# Patient Record
Sex: Male | Born: 1990 | Hispanic: Yes | Marital: Single | Smoking: Current every day smoker
Health system: Southern US, Community
[De-identification: ages and names within clinical notes are randomized; demographics above are authoritative.]

---

## 2013-11-15 ENCOUNTER — Emergency Department (HOSPITAL_COMMUNITY)
Admission: EM | Admit: 2013-11-15 | Discharge: 2013-11-15 | Disposition: A | Discharge status: Home / Self Care | Payer: No Typology Code available for payment source | Attending: Emergency Medicine | Admitting: Emergency Medicine

## 2013-11-15 ENCOUNTER — Emergency Department (HOSPITAL_COMMUNITY): Payer: No Typology Code available for payment source

## 2013-11-15 ENCOUNTER — Encounter (HOSPITAL_COMMUNITY): Payer: Self-pay | Admitting: Emergency Medicine

## 2013-11-15 DIAGNOSIS — S6990XA Unspecified injury of unspecified wrist, hand and finger(s), initial encounter: Secondary | ICD-10-CM | POA: Insufficient documentation

## 2013-11-15 DIAGNOSIS — Y9389 Activity, other specified: Secondary | ICD-10-CM | POA: Insufficient documentation

## 2013-11-15 DIAGNOSIS — S298XXA Other specified injuries of thorax, initial encounter: Secondary | ICD-10-CM | POA: Insufficient documentation

## 2013-11-15 DIAGNOSIS — S161XXA Strain of muscle, fascia and tendon at neck level, initial encounter: Secondary | ICD-10-CM

## 2013-11-15 DIAGNOSIS — S060X9A Concussion with loss of consciousness of unspecified duration, initial encounter: Secondary | ICD-10-CM | POA: Insufficient documentation

## 2013-11-15 DIAGNOSIS — T148XXA Other injury of unspecified body region, initial encounter: Secondary | ICD-10-CM

## 2013-11-15 DIAGNOSIS — Y92410 Unspecified street and highway as the place of occurrence of the external cause: Secondary | ICD-10-CM | POA: Insufficient documentation

## 2013-11-15 DIAGNOSIS — S139XXA Sprain of joints and ligaments of unspecified parts of neck, initial encounter: Secondary | ICD-10-CM | POA: Insufficient documentation

## 2013-11-15 DIAGNOSIS — IMO0002 Reserved for concepts with insufficient information to code with codable children: Secondary | ICD-10-CM | POA: Insufficient documentation

## 2013-11-15 MED ORDER — CYCLOBENZAPRINE HCL 5 MG PO TABS: 5.0000 mg | ORAL_TABLET | Freq: Three times a day (TID) | ORAL | Status: DC | PRN

## 2013-11-15 MED ORDER — HYDROCODONE-ACETAMINOPHEN 5-325 MG PO TABS: 1.0000 | ORAL_TABLET | Freq: Every evening | ORAL | Status: DC | PRN

## 2013-11-15 MED ORDER — IBUPROFEN 600 MG PO TABS: 600.0000 mg | ORAL_TABLET | Freq: Four times a day (QID) | ORAL | Status: DC | PRN

## 2013-11-15 MED ORDER — OXYCODONE-ACETAMINOPHEN 5-325 MG PO TABS
1.0000 | ORAL_TABLET | Freq: Once | ORAL | Status: AC
  Administered 2013-11-15: 1 via ORAL
  Filled 2013-11-15: qty 1

## 2013-11-15 NOTE — ED Notes (Signed)
Pt given d/c instructions and verbalized understanding. NAD at this time. VS are stable.  
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
## 2013-11-16 NOTE — ED Provider Notes (Signed)
Medical screening examination/treatment/procedure(s) were performed by non-physician practitioner and as supervising physician I was immediately available for consultation/collaboration.  EKG Interpretation   None        Shon Baton, MD 11/16/13 [phone]

## 2014-04-22 DIAGNOSIS — S0083XA Contusion of other part of head, initial encounter: Principal | ICD-10-CM | POA: Insufficient documentation

## 2014-04-22 DIAGNOSIS — Z23 Encounter for immunization: Secondary | ICD-10-CM | POA: Insufficient documentation

## 2014-04-22 DIAGNOSIS — F17200 Nicotine dependence, unspecified, uncomplicated: Secondary | ICD-10-CM | POA: Insufficient documentation

## 2014-04-22 DIAGNOSIS — S1093XA Contusion of unspecified part of neck, initial encounter: Principal | ICD-10-CM

## 2014-04-22 DIAGNOSIS — S298XXA Other specified injuries of thorax, initial encounter: Secondary | ICD-10-CM | POA: Insufficient documentation

## 2014-04-22 DIAGNOSIS — S0003XA Contusion of scalp, initial encounter: Secondary | ICD-10-CM | POA: Insufficient documentation
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
## 2014-04-23 ENCOUNTER — Emergency Department (HOSPITAL_COMMUNITY)
Admission: EM | Admit: 2014-04-23 | Discharge: 2014-04-23 | Disposition: A | Discharge status: Home / Self Care | Payer: Self-pay | Attending: Emergency Medicine | Admitting: Emergency Medicine

## 2014-04-23 ENCOUNTER — Emergency Department (HOSPITAL_COMMUNITY): Payer: Self-pay

## 2014-04-23 ENCOUNTER — Encounter (HOSPITAL_COMMUNITY): Payer: Self-pay | Admitting: Emergency Medicine

## 2014-04-23 DIAGNOSIS — S0083XA Contusion of other part of head, initial encounter: Secondary | ICD-10-CM

## 2014-04-23 LAB — CBG MONITORING, ED: GLUCOSE-CAPILLARY: 89 mg/dL (ref 70–99)

## 2014-04-23 MED ORDER — IBUPROFEN 800 MG PO TABS: 800.0000 mg | ORAL_TABLET | Freq: Three times a day (TID) | ORAL | Status: DC

## 2014-04-23 MED ORDER — TETANUS-DIPHTH-ACELL PERTUSSIS 5-2.5-18.5 LF-MCG/0.5 IM SUSP
0.5000 mL | Freq: Once | INTRAMUSCULAR | Status: AC
  Administered 2014-04-23: 0.5 mL via INTRAMUSCULAR
  Filled 2014-04-23: qty 0.5

## 2014-04-23 MED ORDER — IBUPROFEN 800 MG PO TABS
800.0000 mg | ORAL_TABLET | Freq: Once | ORAL | Status: AC
  Administered 2014-04-23: 800 mg via ORAL
  Filled 2014-04-23: qty 1
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
## 2014-04-23 NOTE — ED Provider Notes (Signed)
Medical screening examination/treatment/procedure(s) were performed by non-physician practitioner and as supervising physician I was immediately available for consultation/collaboration.   EKG Interpretation None        Enid Skeens, MD 04/23/14 [id number]

## 2014-11-23 ENCOUNTER — Encounter (HOSPITAL_COMMUNITY): Admission: EM | Disposition: A | Discharge status: Home / Self Care | Payer: Self-pay | Source: Home / Self Care

## 2014-11-23 ENCOUNTER — Encounter (HOSPITAL_COMMUNITY): Payer: Self-pay | Admitting: Emergency Medicine

## 2014-11-23 ENCOUNTER — Emergency Department (HOSPITAL_COMMUNITY): Payer: Self-pay

## 2014-11-23 ENCOUNTER — Inpatient Hospital Stay (HOSPITAL_COMMUNITY): Payer: Self-pay

## 2014-11-23 ENCOUNTER — Emergency Department (HOSPITAL_COMMUNITY): Payer: Self-pay | Admitting: Anesthesiology

## 2014-11-23 ENCOUNTER — Inpatient Hospital Stay (HOSPITAL_COMMUNITY)
Admission: EM | Admit: 2014-11-23 | Discharge: 2014-11-26 | DRG: 576 | Disposition: A | Discharge status: Home / Self Care | Payer: Self-pay | Attending: Orthopaedic Surgery | Admitting: Orthopaedic Surgery

## 2014-11-23 DIAGNOSIS — S55191A Other specified injury of radial artery at forearm level, right arm, initial encounter: Secondary | ICD-10-CM | POA: Diagnosis present

## 2014-11-23 DIAGNOSIS — T1490XA Injury, unspecified, initial encounter: Secondary | ICD-10-CM

## 2014-11-23 DIAGNOSIS — W19XXXA Unspecified fall, initial encounter: Secondary | ICD-10-CM

## 2014-11-23 DIAGNOSIS — F10929 Alcohol use, unspecified with intoxication, unspecified: Secondary | ICD-10-CM | POA: Diagnosis present

## 2014-11-23 DIAGNOSIS — D696 Thrombocytopenia, unspecified: Secondary | ICD-10-CM | POA: Diagnosis present

## 2014-11-23 DIAGNOSIS — S65091A Other specified injury of ulnar artery at wrist and hand level of right arm, initial encounter: Secondary | ICD-10-CM | POA: Diagnosis present

## 2014-11-23 DIAGNOSIS — W3301XA Accidental discharge of shotgun, initial encounter: Secondary | ICD-10-CM

## 2014-11-23 DIAGNOSIS — J9600 Acute respiratory failure, unspecified whether with hypoxia or hypercapnia: Secondary | ICD-10-CM | POA: Diagnosis present

## 2014-11-23 DIAGNOSIS — D62 Acute posthemorrhagic anemia: Secondary | ICD-10-CM | POA: Diagnosis present

## 2014-11-23 DIAGNOSIS — R571 Hypovolemic shock: Secondary | ICD-10-CM | POA: Diagnosis present

## 2014-11-23 DIAGNOSIS — S648X1A Injury of other nerves at wrist and hand level of right arm, initial encounter: Secondary | ICD-10-CM | POA: Diagnosis present

## 2014-11-23 DIAGNOSIS — S5421XA Injury of radial nerve at forearm level, right arm, initial encounter: Secondary | ICD-10-CM

## 2014-11-23 DIAGNOSIS — R4182 Altered mental status, unspecified: Secondary | ICD-10-CM

## 2014-11-23 DIAGNOSIS — S46929A Laceration of unspecified muscle, fascia and tendon at shoulder and upper arm level, unspecified arm, initial encounter: Secondary | ICD-10-CM | POA: Diagnosis present

## 2014-11-23 DIAGNOSIS — Y9389 Activity, other specified: Secondary | ICD-10-CM

## 2014-11-23 DIAGNOSIS — S41119A Laceration without foreign body of unspecified upper arm, initial encounter: Secondary | ICD-10-CM | POA: Diagnosis present

## 2014-11-23 DIAGNOSIS — M542 Cervicalgia: Secondary | ICD-10-CM

## 2014-11-23 DIAGNOSIS — N179 Acute kidney failure, unspecified: Secondary | ICD-10-CM | POA: Diagnosis present

## 2014-11-23 DIAGNOSIS — Y9289 Other specified places as the place of occurrence of the external cause: Secondary | ICD-10-CM

## 2014-11-23 DIAGNOSIS — S41111A Laceration without foreign body of right upper arm, initial encounter: Principal | ICD-10-CM | POA: Diagnosis present

## 2014-11-23 DIAGNOSIS — R000 Tachycardia, unspecified: Secondary | ICD-10-CM | POA: Diagnosis present

## 2014-11-23 DIAGNOSIS — F10129 Alcohol abuse with intoxication, unspecified: Secondary | ICD-10-CM | POA: Diagnosis present

## 2014-11-23 HISTORY — PX: TENDON REPAIR: SHX5111

## 2014-11-23 HISTORY — PX: ARTERY REPAIR: SHX559

## 2014-11-23 HISTORY — PX: FLEXOR TENDON REPAIR: SHX6501

## 2014-11-23 HISTORY — PX: DORSAL COMPARTMENT RELEASE: SHX5039

## 2014-11-23 LAB — POCT I-STAT 7, (LYTES, BLD GAS, ICA,H+H)
Acid-base deficit: 11 mmol/L — ABNORMAL HIGH (ref 0.0–2.0)
Bicarbonate: 14.7 mEq/L — ABNORMAL LOW (ref 20.0–24.0)
Calcium, Ion: 0.69 mmol/L — ABNORMAL LOW (ref 1.12–1.23)
HCT: 33 % — ABNORMAL LOW (ref 39.0–52.0)
Hemoglobin: 11.2 g/dL — ABNORMAL LOW (ref 13.0–17.0)
O2 SAT: 100 %
Patient temperature: 34.1
Potassium: 6.5 mmol/L (ref 3.5–5.1)
Sodium: 143 mmol/L (ref 135–145)
TCO2: 16 mmol/L (ref 0–100)
pCO2 arterial: 28.4 mmHg — ABNORMAL LOW (ref 35.0–45.0)
pH, Arterial: 7.309 — ABNORMAL LOW (ref 7.350–7.450)
pO2, Arterial: 354 mmHg — ABNORMAL HIGH (ref 80.0–100.0)

## 2014-11-23 LAB — I-STAT CHEM 8, ED
BUN: 15 mg/dL (ref 6–23)
Calcium, Ion: 0.99 mmol/L — ABNORMAL LOW (ref 1.12–1.23)
Chloride: 109 mEq/L (ref 96–112)
Creatinine, Ser: 1.9 mg/dL — ABNORMAL HIGH (ref 0.50–1.35)
Glucose, Bld: 166 mg/dL — ABNORMAL HIGH (ref 70–99)
HEMATOCRIT: 34 % — AB (ref 39.0–52.0)
HEMOGLOBIN: 11.6 g/dL — AB (ref 13.0–17.0)
Potassium: 4.1 mmol/L (ref 3.5–5.1)
Sodium: 146 mmol/L — ABNORMAL HIGH (ref 135–145)
TCO2: 13 mmol/L (ref 0–100)

## 2014-11-23 LAB — COMPREHENSIVE METABOLIC PANEL
ALBUMIN: 2.6 g/dL — AB (ref 3.5–5.2)
ALK PHOS: 56 U/L (ref 39–117)
ALK PHOS: 42 U/L (ref 39–117)
ALT: 22 U/L (ref 0–53)
ALT: 39 U/L (ref 0–53)
ANION GAP: 16 — AB (ref 5–15)
AST: 54 U/L — ABNORMAL HIGH (ref 0–37)
AST: 70 U/L — ABNORMAL HIGH (ref 0–37)
Albumin: 2.9 g/dL — ABNORMAL LOW (ref 3.5–5.2)
Anion gap: 9 (ref 5–15)
BUN: 13 mg/dL (ref 6–23)
BUN: 8 mg/dL (ref 6–23)
CHLORIDE: 111 meq/L (ref 96–112)
CO2: 17 mmol/L — ABNORMAL LOW (ref 19–32)
CO2: 21 mmol/L (ref 19–32)
CREATININE: 1.58 mg/dL — AB (ref 0.50–1.35)
Calcium: 8 mg/dL — ABNORMAL LOW (ref 8.4–10.5)
Calcium: 6.7 mg/dL — ABNORMAL LOW (ref 8.4–10.5)
Chloride: 119 mEq/L — ABNORMAL HIGH (ref 96–112)
Creatinine, Ser: 0.83 mg/dL (ref 0.50–1.35)
GFR calc Af Amer: 70 mL/min — ABNORMAL LOW (ref >90)
GFR calc Af Amer: >90 mL/min (ref >90)
GFR calc non Af Amer: >90 mL/min (ref >90)
GFR, EST NON AFRICAN AMERICAN: 61 mL/min — AB (ref >90)
GLUCOSE: 83 mg/dL (ref 70–99)
Glucose, Bld: 188 mg/dL — ABNORMAL HIGH (ref 70–99)
POTASSIUM: 3.2 mmol/L — AB (ref 3.5–5.1)
Potassium: 4.1 mmol/L (ref 3.5–5.1)
SODIUM: 149 mmol/L — AB (ref 135–145)
Sodium: 144 mmol/L (ref 135–145)
TOTAL PROTEIN: 4 g/dL — AB (ref 6.0–8.3)
Total Bilirubin: 0.3 mg/dL (ref 0.3–1.2)
Total Bilirubin: 1 mg/dL (ref 0.3–1.2)
Total Protein: 4.9 g/dL — ABNORMAL LOW (ref 6.0–8.3)

## 2014-11-23 LAB — CBC
HCT: 35.7 % — ABNORMAL LOW (ref 39.0–52.0)
HCT: 36.8 % — ABNORMAL LOW (ref 39.0–52.0)
HCT: 36.5 % — ABNORMAL LOW (ref 39.0–52.0)
HEMOGLOBIN: 12.7 g/dL — AB (ref 13.0–17.0)
Hemoglobin: 11.2 g/dL — ABNORMAL LOW (ref 13.0–17.0)
Hemoglobin: 12.3 g/dL — ABNORMAL LOW (ref 13.0–17.0)
MCH: 30.2 pg (ref 26.0–34.0)
MCH: 30.1 pg (ref 26.0–34.0)
MCH: 29.5 pg (ref 26.0–34.0)
MCHC: 31.4 g/dL (ref 30.0–36.0)
MCHC: 33.4 g/dL (ref 30.0–36.0)
MCHC: 34.8 g/dL (ref 30.0–36.0)
MCV: 96.2 fL (ref 78.0–100.0)
MCV: 90.2 fL (ref 78.0–100.0)
MCV: 84.7 fL (ref 78.0–100.0)
Platelets: 202 10*3/uL (ref 150–400)
Platelets: 60 10*3/uL — ABNORMAL LOW (ref 150–400)
Platelets: 63 10*3/uL — ABNORMAL LOW (ref 150–400)
RBC: 3.71 MIL/uL — AB (ref 4.22–5.81)
RBC: 4.08 MIL/uL — ABNORMAL LOW (ref 4.22–5.81)
RBC: 4.31 MIL/uL (ref 4.22–5.81)
RDW: 13.4 % (ref 11.5–15.5)
RDW: 14.8 % (ref 11.5–15.5)
RDW: 15.3 % (ref 11.5–15.5)
WBC: 5.4 10*3/uL (ref 4.0–10.5)
WBC: 16.6 10*3/uL — AB (ref 4.0–10.5)
WBC: 12.1 10*3/uL — ABNORMAL HIGH (ref 4.0–10.5)

## 2014-11-23 LAB — PROTIME-INR
INR: 1.19 (ref 0.00–1.49)
INR: 1.9 — AB (ref 0.00–1.49)
INR: 1.64 — ABNORMAL HIGH (ref 0.00–1.49)
INR: 1.44 (ref 0.00–1.49)
PROTHROMBIN TIME: 15.2 s (ref 11.6–15.2)
PROTHROMBIN TIME: 22 s — AB (ref 11.6–15.2)
PROTHROMBIN TIME: 19.5 s — AB (ref 11.6–15.2)
PROTHROMBIN TIME: 17.7 s — AB (ref 11.6–15.2)

## 2014-11-23 LAB — URINALYSIS, ROUTINE W REFLEX MICROSCOPIC
BILIRUBIN URINE: NEGATIVE
Glucose, UA: NEGATIVE mg/dL
Hgb urine dipstick: NEGATIVE
Ketones, ur: NEGATIVE mg/dL
Leukocytes, UA: NEGATIVE
NITRITE: NEGATIVE
PROTEIN: NEGATIVE mg/dL
Specific Gravity, Urine: 1.006 (ref 1.005–1.030)
UROBILINOGEN UA: 0.2 mg/dL (ref 0.0–1.0)
pH: 5.5 (ref 5.0–8.0)

## 2014-11-23 LAB — DIC (DISSEMINATED INTRAVASCULAR COAGULATION)PANEL
D-Dimer, Quant: 5.99 ug/mL-FEU — ABNORMAL HIGH (ref 0.00–0.48)
Fibrinogen: 122 mg/dL — ABNORMAL LOW (ref 204–475)
INR: 1.76 — ABNORMAL HIGH (ref 0.00–1.49)
Platelets: 84 10*3/uL — ABNORMAL LOW (ref 150–400)
Smear Review: NONE SEEN

## 2014-11-23 LAB — POCT I-STAT 4, (NA,K, GLUC, HGB,HCT)
GLUCOSE: 130 mg/dL — AB (ref 70–99)
GLUCOSE: 90 mg/dL (ref 70–99)
Glucose, Bld: 96 mg/dL (ref 70–99)
HCT: 29 % — ABNORMAL LOW (ref 39.0–52.0)
HCT: 30 % — ABNORMAL LOW (ref 39.0–52.0)
HEMATOCRIT: 31 % — AB (ref 39.0–52.0)
HEMOGLOBIN: 10.2 g/dL — AB (ref 13.0–17.0)
HEMOGLOBIN: 10.5 g/dL — AB (ref 13.0–17.0)
Hemoglobin: 9.9 g/dL — ABNORMAL LOW (ref 13.0–17.0)
POTASSIUM: 4.6 mmol/L (ref 3.5–5.1)
POTASSIUM: 3.5 mmol/L (ref 3.5–5.1)
Potassium: 4.8 mmol/L (ref 3.5–5.1)
SODIUM: 147 mmol/L — AB (ref 135–145)
SODIUM: 151 mmol/L — AB (ref 135–145)
Sodium: 149 mmol/L — ABNORMAL HIGH (ref 135–145)

## 2014-11-23 LAB — GLUCOSE, CAPILLARY: Glucose-Capillary: 81 mg/dL (ref 70–99)

## 2014-11-23 LAB — ETHANOL: ALCOHOL ETHYL (B): 387 mg/dL — AB (ref 0–9)

## 2014-11-23 LAB — MRSA PCR SCREENING: MRSA by PCR: NEGATIVE

## 2014-11-23 LAB — APTT
aPTT: >200 seconds (ref 24–37)
aPTT: 33 seconds (ref 24–37)
aPTT: 33 seconds (ref 24–37)

## 2014-11-23 LAB — CDS SEROLOGY

## 2014-11-23 LAB — ABO/RH: ABO/RH(D): A POS

## 2014-11-23 LAB — DIC (DISSEMINATED INTRAVASCULAR COAGULATION) PANEL: PROTHROMBIN TIME: 20.7 s — AB (ref 11.6–15.2)

## 2014-11-23 LAB — TRIGLYCERIDES: Triglycerides: 68 mg/dL (ref <150)

## 2014-11-23 LAB — I-STAT CG4 LACTIC ACID, ED: Lactic Acid, Venous: 9.79 mmol/L — ABNORMAL HIGH (ref 0.5–2.2)

## 2014-11-23 SURGERY — REPAIR, ARTERY, BRACHIAL
Anesthesia: General | Site: Arm Upper | Laterality: Right

## 2014-11-23 MED ORDER — SODIUM CHLORIDE 0.9 % IV SOLN
100.0000 ug/h | INTRAVENOUS | Status: DC
  Administered 2014-11-23 – 2014-11-24 (×2): 100 ug/h via INTRAVENOUS
  Filled 2014-11-23 (×2): qty 50

## 2014-11-23 MED ORDER — TETANUS-DIPHTH-ACELL PERTUSSIS 5-2.5-18.5 LF-MCG/0.5 IM SUSP
0.5000 mL | Freq: Once | INTRAMUSCULAR | Status: AC
  Administered 2014-11-23: 0.5 mL via INTRAMUSCULAR

## 2014-11-23 MED ORDER — HYDROMORPHONE HCL 1 MG/ML IJ SOLN: 0.2500 mg | INTRAMUSCULAR | Status: DC | PRN

## 2014-11-23 MED ORDER — TETANUS-DIPHTH-ACELL PERTUSSIS 5-2.5-18.5 LF-MCG/0.5 IM SUSP
INTRAMUSCULAR | Status: AC
Start: 2014-11-23 — End: 2014-11-23
  Administered 2014-11-23: 0.5 mL via INTRAMUSCULAR
  Filled 2014-11-23: qty 0.5

## 2014-11-23 MED ORDER — SODIUM CHLORIDE 0.9 % IR SOLN
Status: DC | PRN
  Administered 2014-11-23: 1000 mL

## 2014-11-23 MED ORDER — POTASSIUM CHLORIDE IN NACL 20-0.9 MEQ/L-% IV SOLN
INTRAVENOUS | Status: DC
  Administered 2014-11-23 – 2014-11-25 (×4): via INTRAVENOUS
  Filled 2014-11-23 (×7): qty 1000

## 2014-11-23 MED ORDER — ARTIFICIAL TEARS OP OINT
TOPICAL_OINTMENT | OPHTHALMIC | Status: DC | PRN
  Administered 2014-11-23: 1 via OPHTHALMIC

## 2014-11-23 MED ORDER — FENTANYL CITRATE 0.05 MG/ML IJ SOLN
INTRAMUSCULAR | Status: AC
  Filled 2014-11-23: qty 2

## 2014-11-23 MED ORDER — PROTAMINE SULFATE 10 MG/ML IV SOLN
INTRAVENOUS | Status: DC | PRN
  Administered 2014-11-23: 50 mg via INTRAVENOUS

## 2014-11-23 MED ORDER — PANTOPRAZOLE SODIUM 40 MG PO TBEC
40.0000 mg | DELAYED_RELEASE_TABLET | Freq: Every day | ORAL | Status: DC
  Administered 2014-11-24: 40 mg via ORAL
  Filled 2014-11-23: qty 1

## 2014-11-23 MED ORDER — ROCURONIUM BROMIDE 50 MG/5ML IV SOLN
INTRAVENOUS | Status: AC | PRN
  Administered 2014-11-23: 100 mg via INTRAVENOUS

## 2014-11-23 MED ORDER — FENTANYL CITRATE 0.05 MG/ML IJ SOLN
INTRAMUSCULAR | Status: AC
  Filled 2014-11-23: qty 5

## 2014-11-23 MED ORDER — PANTOPRAZOLE SODIUM 40 MG IV SOLR
40.0000 mg | Freq: Every day | INTRAVENOUS | Status: DC
  Administered 2014-11-23: 40 mg via INTRAVENOUS
  Filled 2014-11-23 (×3): qty 40

## 2014-11-23 MED ORDER — CHLORHEXIDINE GLUCONATE 0.12 % MT SOLN
15.0000 mL | Freq: Two times a day (BID) | OROMUCOSAL | Status: DC
  Administered 2014-11-23 – 2014-11-24 (×3): 15 mL via OROMUCOSAL
  Filled 2014-11-23 (×3): qty 15

## 2014-11-23 MED ORDER — MIDAZOLAM HCL 2 MG/2ML IJ SOLN
INTRAMUSCULAR | Status: AC
  Filled 2014-11-23: qty 2

## 2014-11-23 MED ORDER — SODIUM BICARBONATE 8.4 % IV SOLN
INTRAVENOUS | Status: DC | PRN
  Administered 2014-11-23: 150 meq via INTRAVENOUS

## 2014-11-23 MED ORDER — PROPOFOL 10 MG/ML IV BOLUS
INTRAVENOUS | Status: AC | PRN
  Administered 2014-11-23: 10 ug via INTRAVENOUS

## 2014-11-23 MED ORDER — VECURONIUM BROMIDE 10 MG IV SOLR
INTRAVENOUS | Status: DC | PRN
  Administered 2014-11-23 (×2): 10 mg via INTRAVENOUS

## 2014-11-23 MED ORDER — DIPHENHYDRAMINE HCL 50 MG/ML IJ SOLN
25.0000 mg | Freq: Once | INTRAMUSCULAR | Status: AC
  Administered 2014-11-23: 25 mg via INTRAVENOUS
  Filled 2014-11-23: qty 1

## 2014-11-23 MED ORDER — LACTATED RINGERS IV SOLN
INTRAVENOUS | Status: DC | PRN
  Administered 2014-11-23 (×2): via INTRAVENOUS

## 2014-11-23 MED ORDER — CEFAZOLIN SODIUM-DEXTROSE 2-3 GM-% IV SOLR
INTRAVENOUS | Status: AC
  Filled 2014-11-23: qty 50

## 2014-11-23 MED ORDER — ETOMIDATE 2 MG/ML IV SOLN
INTRAVENOUS | Status: AC
  Filled 2014-11-23: qty 20

## 2014-11-23 MED ORDER — ROCURONIUM BROMIDE 50 MG/5ML IV SOLN
INTRAVENOUS | Status: AC
  Filled 2014-11-23: qty 2

## 2014-11-23 MED ORDER — CETYLPYRIDINIUM CHLORIDE 0.05 % MT LIQD
7.0000 mL | Freq: Four times a day (QID) | OROMUCOSAL | Status: DC
  Administered 2014-11-23 – 2014-11-24 (×3): 7 mL via OROMUCOSAL

## 2014-11-23 MED ORDER — HEPARIN SODIUM (PORCINE) 1000 UNIT/ML IJ SOLN
INTRAMUSCULAR | Status: DC | PRN
  Administered 2014-11-23: 5000 [IU] via INTRAVENOUS

## 2014-11-23 MED ORDER — PROPOFOL 10 MG/ML IV EMUL
0.0000 ug/kg/min | INTRAVENOUS | Status: DC
  Administered 2014-11-23 – 2014-11-24 (×5): 50 ug/kg/min via INTRAVENOUS
  Filled 2014-11-23 (×5): qty 100

## 2014-11-23 MED ORDER — FENTANYL CITRATE 0.05 MG/ML IJ SOLN
INTRAMUSCULAR | Status: AC | PRN
  Administered 2014-11-23: 25 ug via INTRAVENOUS

## 2014-11-23 MED ORDER — SUCCINYLCHOLINE CHLORIDE 20 MG/ML IJ SOLN
INTRAMUSCULAR | Status: AC
Start: 2014-11-23 — End: 2014-11-23
  Filled 2014-11-23: qty 1

## 2014-11-23 MED ORDER — FENTANYL CITRATE 0.05 MG/ML IJ SOLN
INTRAMUSCULAR | Status: DC | PRN
Start: 2014-11-23 — End: 2014-11-23
  Administered 2014-11-23 (×5): 100 ug via INTRAVENOUS

## 2014-11-23 MED ORDER — ALBUMIN HUMAN 5 % IV SOLN
INTRAVENOUS | Status: DC | PRN
  Administered 2014-11-23: 07:00:00 via INTRAVENOUS

## 2014-11-23 MED ORDER — SODIUM CHLORIDE 0.9 % IR SOLN
Status: DC | PRN
  Administered 2014-11-23: 500 mL

## 2014-11-23 MED ORDER — ETOMIDATE 2 MG/ML IV SOLN
INTRAVENOUS | Status: AC | PRN
  Administered 2014-11-23: 20 mg via INTRAVENOUS

## 2014-11-23 MED ORDER — MIDAZOLAM HCL 5 MG/5ML IJ SOLN
INTRAMUSCULAR | Status: DC | PRN
  Administered 2014-11-23 (×2): 1 mg via INTRAVENOUS
  Administered 2014-11-23: 2 mg via INTRAVENOUS

## 2014-11-23 MED ORDER — CEFAZOLIN SODIUM-DEXTROSE 2-3 GM-% IV SOLR
INTRAVENOUS | Status: DC | PRN
  Administered 2014-11-23 (×2): 2 g via INTRAVENOUS

## 2014-11-23 MED ORDER — SODIUM CHLORIDE 0.9 % IV SOLN
INTRAVENOUS | Status: DC | PRN
  Administered 2014-11-23 (×5): via INTRAVENOUS

## 2014-11-23 MED ORDER — ETOMIDATE 2 MG/ML IV SOLN: 20.0000 mg | Freq: Once | INTRAVENOUS | Status: DC

## 2014-11-23 MED ORDER — PROPOFOL INFUSION 10 MG/ML OPTIME
INTRAVENOUS | Status: DC | PRN
  Administered 2014-11-23: 10 ug/kg/min via INTRAVENOUS

## 2014-11-23 MED ORDER — LIDOCAINE HCL (CARDIAC) 20 MG/ML IV SOLN
INTRAVENOUS | Status: AC
Start: 2014-11-23 — End: 2014-11-23
  Filled 2014-11-23: qty 5

## 2014-11-23 MED ORDER — ROCURONIUM BROMIDE 50 MG/5ML IV SOLN: 1.0000 mg/kg | Freq: Once | INTRAVENOUS | Status: DC

## 2014-11-23 MED ORDER — FENTANYL CITRATE 0.05 MG/ML IJ SOLN
100.0000 ug | INTRAMUSCULAR | Status: DC | PRN
  Administered 2014-11-23 (×2): 100 ug via INTRAVENOUS
  Filled 2014-11-23 (×2): qty 2

## 2014-11-23 MED ORDER — CALCIUM CHLORIDE 10 % IV SOLN
INTRAVENOUS | Status: DC | PRN
  Administered 2014-11-23 (×5): 200 mg via INTRAVENOUS

## 2014-11-23 SURGICAL SUPPLY — 64 items
BANDAGE ELASTIC 6 VELCRO ST LF (GAUZE/BANDAGES/DRESSINGS) IMPLANT
BNDG ESMARK 4X9 LF (GAUZE/BANDAGES/DRESSINGS) ×4 IMPLANT
CANISTER SUCTION 2500CC (MISCELLANEOUS) ×4 IMPLANT
CANNULA VESSEL 3MM 2 BLNT TIP (CANNULA) ×4 IMPLANT
CATH EMB 3FR 80CM (CATHETERS) ×8 IMPLANT
CLIP TI MEDIUM 24 (CLIP) ×4 IMPLANT
CLIP TI MEDIUM 6 (CLIP) ×4 IMPLANT
CLIP TI WIDE RED SMALL 24 (CLIP) ×4 IMPLANT
CLIP TI WIDE RED SMALL 6 (CLIP) ×4 IMPLANT
COVER SURGICAL LIGHT HANDLE (MISCELLANEOUS) ×4 IMPLANT
CUFF TOURNIQUET SINGLE 18IN (TOURNIQUET CUFF) ×4 IMPLANT
CUFF TOURNIQUET SINGLE 24IN (TOURNIQUET CUFF) IMPLANT
CUFF TOURNIQUET SINGLE 34IN LL (TOURNIQUET CUFF) IMPLANT
DRAPE INCISE IOBAN 66X45 STRL (DRAPES) ×4 IMPLANT
DRAPE ORTHO SPLIT 77X108 STRL (DRAPES) ×2
DRAPE SURG ORHT 6 SPLT 77X108 (DRAPES) ×2 IMPLANT
ELECT REM PT RETURN 9FT ADLT (ELECTROSURGICAL) ×4
ELECTRODE REM PT RTRN 9FT ADLT (ELECTROSURGICAL) ×2 IMPLANT
GAUZE SPONGE 4X4 12PLY STRL (GAUZE/BANDAGES/DRESSINGS) ×4 IMPLANT
GAUZE XEROFORM 5X9 LF (GAUZE/BANDAGES/DRESSINGS) ×4 IMPLANT
GLOVE BIO SURGEON STRL SZ7.5 (GLOVE) ×8 IMPLANT
GLOVE BIO SURGEON STRL SZ8 (GLOVE) ×4 IMPLANT
GLOVE BIOGEL PI IND STRL 6.5 (GLOVE) ×4 IMPLANT
GLOVE BIOGEL PI IND STRL 7.0 (GLOVE) ×2 IMPLANT
GLOVE BIOGEL PI IND STRL 7.5 (GLOVE) ×2 IMPLANT
GLOVE BIOGEL PI IND STRL 8 (GLOVE) ×4 IMPLANT
GLOVE BIOGEL PI INDICATOR 6.5 (GLOVE) ×4
GLOVE BIOGEL PI INDICATOR 7.0 (GLOVE) ×2
GLOVE BIOGEL PI INDICATOR 7.5 (GLOVE) ×2
GLOVE BIOGEL PI INDICATOR 8 (GLOVE) ×4
GLOVE BIOGEL PI ORTHO PRO 7.5 (GLOVE) ×2
GLOVE PI ORTHO PRO STRL 7.5 (GLOVE) ×2 IMPLANT
GOWN STRL REUS W/ TWL LRG LVL3 (GOWN DISPOSABLE) ×6 IMPLANT
GOWN STRL REUS W/TWL LRG LVL3 (GOWN DISPOSABLE) ×6
KIT BASIN OR (CUSTOM PROCEDURE TRAY) ×4 IMPLANT
KIT ROOM TURNOVER OR (KITS) ×4 IMPLANT
LIQUID BAND (GAUZE/BANDAGES/DRESSINGS) ×4 IMPLANT
NS IRRIG 1000ML POUR BTL (IV SOLUTION) ×4 IMPLANT
PACK CV ACCESS (CUSTOM PROCEDURE TRAY) ×4 IMPLANT
PAD ARMBOARD 7.5X6 YLW CONV (MISCELLANEOUS) ×8 IMPLANT
PAD CAST 4YDX4 CTTN HI CHSV (CAST SUPPLIES) ×2 IMPLANT
PADDING CAST COTTON 4X4 STRL (CAST SUPPLIES) ×2
PADDING CAST COTTON 6X4 STRL (CAST SUPPLIES) ×4 IMPLANT
SLEEVE SURGEON STRL (DRAPES) ×4 IMPLANT
SPLINT PLASTER CAST XFAST 4X15 (CAST SUPPLIES) ×2 IMPLANT
SPLINT PLASTER XTRA FAST SET 4 (CAST SUPPLIES) ×2
SPONGE GAUZE 4X4 12PLY STER LF (GAUZE/BANDAGES/DRESSINGS) ×4 IMPLANT
SPONGE LAP 18X18 X RAY DECT (DISPOSABLE) ×4 IMPLANT
SPONGE LAP 4X18 X RAY DECT (DISPOSABLE) ×4 IMPLANT
STAPLER VISISTAT 35W (STAPLE) ×4 IMPLANT
SUT ETHILON 8 0 BV130 4 (SUTURE) ×4 IMPLANT
SUT PROLENE 6 0 BV (SUTURE) ×16 IMPLANT
SUT VIC AB 2-0 CT1 27 (SUTURE) ×8
SUT VIC AB 2-0 CT1 TAPERPNT 27 (SUTURE) ×8 IMPLANT
SUT VIC AB 2-0 SH 27 (SUTURE) ×2
SUT VIC AB 2-0 SH 27XBRD (SUTURE) ×2 IMPLANT
SUT VIC AB 3-0 SH 27 (SUTURE) ×4
SUT VIC AB 3-0 SH 27X BRD (SUTURE) ×4 IMPLANT
SUT VICRYL 4-0 PS2 18IN ABS (SUTURE) ×4 IMPLANT
SYR TB 1ML LUER SLIP (SYRINGE) ×4 IMPLANT
SYRINGE 20CC LL (MISCELLANEOUS) ×4 IMPLANT
TOWEL OR 17X24 6PK STRL BLUE (TOWEL DISPOSABLE) ×4 IMPLANT
UNDERPAD 30X30 INCONTINENT (UNDERPADS AND DIAPERS) ×4 IMPLANT
WATER STERILE IRR 1000ML POUR (IV SOLUTION) ×4 IMPLANT
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
## 2014-11-23 NOTE — ED Notes (Signed)
Dr Mora Bellmanni given a copy of lactic acid results 9.79

## 2014-11-23 NOTE — Anesthesia Procedure Notes (Signed)
Date/Time: 11/23/2014 3:45 AM Performed by: Wray KearnsFOLEY, Jathan Balling A Pre-anesthesia Checklist: Patient identified, Timeout performed, Emergency Drugs available, Suction available and Patient being monitored Patient Re-evaluated:Patient Re-evaluated prior to inductionOxygen Delivery Method: Circle system utilized Preoxygenation: Pre-oxygenation with 100% oxygen Intubation Type: Inhalational induction with existing ETT and Combination inhalational/ intravenous induction Placement Confirmation: breath sounds checked- equal and bilateral and positive ETCO2

## 2014-11-23 NOTE — Progress Notes (Signed)
transported pt to CT on vent and back.  Placed pt back on original setting when returned to room
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
## 2014-11-23 NOTE — Anesthesia Preprocedure Evaluation (Signed)
Anesthesia Evaluation  General Assessment Comment:Patient brought up emergently from ED intubated straight to OR. No history able to be obtained except obvious injury. CE  Airway       Comment: Intubated from ED> Dental   Pulmonary  breath sounds clear to auscultation        Cardiovascular Rhythm:Regular Rate:Tachycardia     Neuro/Psych    GI/Hepatic   Endo/Other    Renal/GU      Musculoskeletal   Abdominal   Peds  Hematology   Anesthesia Other Findings   Reproductive/Obstetrics                             Anesthesia Physical Anesthesia Plan  ASA: I and emergent  Anesthesia Plan:    Post-op Pain Management:    Induction: Intravenous  Airway Management Planned: Oral ETT  Additional Equipment:   Intra-op Plan:   Post-operative Plan: Post-operative intubation/ventilation  Informed Consent:   Plan Discussed with: CRNA, Anesthesiologist and Surgeon  Anesthesia Plan Comments:         Anesthesia Quick Evaluation

## 2014-11-23 NOTE — ED Notes (Signed)
MD Oni attempting intubation

## 2014-11-23 NOTE — Progress Notes (Signed)
Pt very agitated to RASS +3; propofol gtt at max dose of 2250mcg/kg/min & too soon for additional fentanyl IVP under current order.  Prince RomeM. Jeffery, PA-C notified; order for fantanyl drip received.

## 2014-11-23 NOTE — ED Notes (Signed)
3rd liter of warmed normal saline started.

## 2014-11-23 NOTE — ED Notes (Signed)
1st Liter of warmed saline started.
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
## 2014-11-23 NOTE — Brief Op Note (Signed)
11/23/2014  7:59 AM  PATIENT:  Curtis Weber  23 y.o. male  PRE-OPERATIVE DIAGNOSIS:  Lacerations, right arm  POST-OPERATIVE DIAGNOSIS:  Lacerations to right arm.  PROCEDURE:  Procedure(s) with comments: BRACHIAL ARTERY REPAIR (Right) - Exploration of right arm laceration with insertion of interposioninal saphenous vein graft to radial and ulnar arteries using vein from right leg. exploration of laceration, nerve repair x2 (Right) - Exploration of wound, nerve repair.   RELEASE FOREARM COMPARTMENT  (Right) REPAIR SUPERFICIAL FLEXOR (Right)  SURGEON:  Surgeon(s) and Role:     * Pryor OchoaJames D Lawson, MD - Primary    * Eldred MangesMark C Zeynab Klett, MD  PHYSICIAN ASSISTANT:   ASSISTANTS: none   ANESTHESIA:   general  EBL:  Total I/O In: -  Out: 800 [Urine:800]  BLOOD ADMINISTERED:  8 U blood 4 FFP  DRAINS: none   LOCAL MEDICATIONS USED:  NONE  SPECIMEN:  No Specimen  DISPOSITION OF SPECIMEN:  N/A  COUNTS:  YES  TOURNIQUET:   Total Tourniquet Time Documented: Upper Arm (Right) - 39 minutes Total: Upper Arm (Right) - 39 minutes   DICTATION: .Other Dictation: Dictation Number [id number]  PLAN OF CARE: Admit to inpatient   PATIENT DISPOSITION:  ICU - intubated and hemodynamically stable.   Delay start of Pharmacological VTE agent (>24hrs) due to surgical blood loss or risk of bleeding: not applicable
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
## 2014-11-23 NOTE — ED Notes (Signed)
Additional prior to arrival tornequet applied to upper right arm above wound.

## 2014-11-23 NOTE — ED Notes (Signed)
2nd liter of warmed normal saline started

## 2014-11-23 NOTE — Progress Notes (Signed)
Pus-like discharge noted at urethral meatus; Trauma PA notified.

## 2014-11-23 NOTE — ED Notes (Signed)
Bleeding controlled at this time. Guaze packing placed in wound by Trauma MD.

## 2014-11-23 NOTE — ED Notes (Signed)
2nd unit of rbc started via lvl 1 infuser.

## 2014-11-23 NOTE — ED Notes (Signed)
2nd unit of blood finished via lvl 1 infuser.

## 2014-11-23 NOTE — ED Notes (Signed)
1st unit of RBC finished via Lvl 1 infuser.

## 2014-11-23 NOTE — ED Notes (Signed)
 20mg  Etomidate 100mg  Rocuronium

## 2014-11-23 NOTE — Transfer of Care (Signed)
Immediate Anesthesia Transfer of Care Note  Patient: Curtis Weber  Procedure(s) Performed: Procedure(s) with comments: BRACHIAL ARTERY REPAIR (Right) - Exploration of right arm laceration with insertion of interposioninal saphenous vein graft to radial and ulnar arteries using vein from right leg. exploration of laceration, nerve repair x2 (Right) - Exploration of wound, nerve repair.   RELEASE FOREARM COMPARTMENT  (Right) REPAIR SUPERFICIAL FLEXOR (Right)  Patient Location: ICU  Anesthesia Type:General  Level of Consciousness: sedated and Patient remains intubated per anesthesia plan  Airway & Oxygen Therapy: Patient remains intubated per anesthesia plan and Patient placed on Ventilator (see vital sign flow sheet for setting)  Post-op Assessment: Report given to PACU RN and Report to Renae FicklePaul, RN on [id number] [id number]  Post vital signs: Reviewed and stable  Complications: No apparent anesthesia complications

## 2014-11-23 NOTE — ED Notes (Signed)
22cm @ teeth, 8.670mm.

## 2014-11-23 NOTE — ED Notes (Addendum)
1st unit of RBC started via Lvl 1 infuser.

## 2014-11-24 ENCOUNTER — Inpatient Hospital Stay (HOSPITAL_COMMUNITY): Payer: Self-pay

## 2014-11-24 DIAGNOSIS — J9600 Acute respiratory failure, unspecified whether with hypoxia or hypercapnia: Secondary | ICD-10-CM | POA: Diagnosis present

## 2014-11-24 DIAGNOSIS — D62 Acute posthemorrhagic anemia: Secondary | ICD-10-CM | POA: Diagnosis present

## 2014-11-24 DIAGNOSIS — F10929 Alcohol use, unspecified with intoxication, unspecified: Secondary | ICD-10-CM | POA: Diagnosis present

## 2014-11-24 LAB — PREPARE FRESH FROZEN PLASMA
UNIT DIVISION: 0
UNIT DIVISION: 0
UNIT DIVISION: 0
Unit division: 0

## 2014-11-24 LAB — CBC
HCT: 36.1 % — ABNORMAL LOW (ref 39.0–52.0)
HEMOGLOBIN: 12.7 g/dL — AB (ref 13.0–17.0)
MCH: 29.6 pg (ref 26.0–34.0)
MCHC: 35.2 g/dL (ref 30.0–36.0)
MCV: 84.1 fL (ref 78.0–100.0)
Platelets: 75 10*3/uL — ABNORMAL LOW (ref 150–400)
RBC: 4.29 MIL/uL (ref 4.22–5.81)
RDW: 16.4 % — ABNORMAL HIGH (ref 11.5–15.5)
WBC: 11.5 10*3/uL — ABNORMAL HIGH (ref 4.0–10.5)

## 2014-11-24 LAB — BASIC METABOLIC PANEL
Anion gap: 7 (ref 5–15)
BUN: <5 mg/dL — ABNORMAL LOW (ref 6–23)
CO2: 26 mmol/L (ref 19–32)
Calcium: 7.9 mg/dL — ABNORMAL LOW (ref 8.4–10.5)
Chloride: 107 mEq/L (ref 96–112)
Creatinine, Ser: 0.83 mg/dL (ref 0.50–1.35)
GFR calc Af Amer: >90 mL/min (ref >90)
Glucose, Bld: 89 mg/dL (ref 70–99)
POTASSIUM: 3.1 mmol/L — AB (ref 3.5–5.1)
Sodium: 140 mmol/L (ref 135–145)

## 2014-11-24 MED ORDER — POTASSIUM CHLORIDE CRYS ER 20 MEQ PO TBCR
20.0000 meq | EXTENDED_RELEASE_TABLET | Freq: Two times a day (BID) | ORAL | Status: DC
  Administered 2014-11-24 – 2014-11-26 (×5): 20 meq via ORAL
  Filled 2014-11-24 (×7): qty 1

## 2014-11-24 MED ORDER — MORPHINE SULFATE 2 MG/ML IJ SOLN
2.0000 mg | INTRAMUSCULAR | Status: DC | PRN
  Administered 2014-11-24 – 2014-11-26 (×7): 2 mg via INTRAVENOUS
  Filled 2014-11-24 (×7): qty 1

## 2014-11-24 MED ORDER — OXYCODONE HCL 5 MG PO TABS
5.0000 mg | ORAL_TABLET | ORAL | Status: DC | PRN
  Administered 2014-11-24: 10 mg via ORAL
  Administered 2014-11-24 – 2014-11-25 (×5): 15 mg via ORAL
  Filled 2014-11-24 (×3): qty 3
  Filled 2014-11-24: qty 2
  Filled 2014-11-24 (×2): qty 3

## 2014-11-24 MED ORDER — ONDANSETRON HCL 4 MG/2ML IJ SOLN
INTRAMUSCULAR | Status: AC
  Filled 2014-11-24: qty 2

## 2014-11-24 MED ORDER — ONDANSETRON HCL 4 MG/2ML IJ SOLN: 4.0000 mg | INTRAMUSCULAR | Status: DC | PRN

## 2014-11-24 MED ORDER — ONDANSETRON HCL 4 MG/2ML IJ SOLN
4.0000 mg | Freq: Once | INTRAMUSCULAR | Status: AC
  Administered 2014-11-24: 4 mg via INTRAVENOUS

## 2014-11-24 NOTE — Progress Notes (Signed)
Flexion/extension c-spine xray results called to M. Jeffery, PA-C; order received to d/c cervical collar.  Collar removed per order.

## 2014-11-24 NOTE — Progress Notes (Signed)
240 cc fentanyl gtt wasted in sink- witnessed by Docia BarrierJosh McDaniel, RN

## 2014-11-24 NOTE — Procedures (Signed)
Extubation Procedure Note  Patient Details:   Name: Murvin DonningMiguel XXXPerez DOB: 04/21/1991 MRN: [id number]   Airway Documentation:  Airway 8 mm (Active)  Secured at (cm) 23 cm 11/24/2014  3:22 AM  Measured From Lips 11/24/2014  3:22 AM  Secured Location Center 11/24/2014  3:22 AM  Secured By [employer] Tube Holder 11/24/2014  3:22 AM  Tube Holder Repositioned Yes 11/24/2014  3:22 AM  Cuff Pressure (cm H2O) 20 cm H2O 11/23/2014 12:01 PM  Site Condition Dry 11/24/2014  3:22 AM    Evaluation  O2 sats: stable throughout Complications: No apparent complications Patient did tolerate procedure well. Bilateral Breath Sounds: Diminished   Yes   Pt extubated per MD order.  RT will continue to monitor.  Sats 100% on 3l [place]  Closson, Terie PurserMorgan Trevion Hoben 11/24/2014, 8:14 AM

## 2014-11-24 NOTE — Progress Notes (Signed)
Patient ID: Curtis Weber, male   DOB: [birth year], 23 y.o.   MRN: [id number]   LOS: 1 day   Subjective: Sedated, on vent. +FC.   Objective: Vital signs in last 24 hours: Temp:  [93.9 F (34.4 C)-100 F (37.8 C)] 100 F (37.8 C) (12/26 0712) Pulse Rate:  [64-158] 81 (12/26 0712) Resp:  [10-28] 16 (12/26 0712) BP: (90-124)/(53-76) 122/67 mmHg (12/26 0712) SpO2:  [96 %-100 %] 100 % (12/26 0712) FiO2 (%):  [30 %-40 %] 30 % (12/26 0322)    VENT: PRVC/30%/5PEEP/RR16/Vt54810ml   UOP: >14700ml/h NET: -16867ml/24h TOTAL: +10L/admission   Laboratory CBC  Recent Labs  11/23/14 0530  11/23/14 0733 11/23/14 1025  WBC 16.6*  --   --  12.1*  HGB 12.3*  < > 10.5* 12.7*  HCT 36.8*  < > 31.0* 36.5*  PLT 84*  60*  --   --  63*  < > = values in this interval not displayed. BMET  Recent Labs  11/23/14 0302 11/23/14 0313  11/23/14 0733 11/23/14 1025  NA 144 146*  < > 151* 149*  K 4.1 4.1  < > 3.5 3.2*  CL 111 109  --   --  119*  CO2 17*  --   --   --  21  GLUCOSE 188* 166*  < > 96 83  BUN 13 15  --   --  8  CREATININE 1.58* 1.90*  --   --  0.83  CALCIUM 8.0*  --   --   --  6.7*  < > = values in this interval not displayed.   Physical Exam General appearance: alert and no distress Resp: clear to auscultation bilaterally Cardio: regular rate and rhythm GI: normal findings: bowel sounds normal and soft, non-tender Extremities: NVI   Assessment/Plan: RUE laceration -- per VVS and ortho ARF -- Will extubate this morning ABL anemia -- Recheck this morning Acute EtOH intoxication FEN -- Check BMET for lyte abnormalities VTE -- SCD's Dispo -- ARF    Freeman CaldronMichael J. Brittanni Cariker, PA-C Pager: [phone] General Trauma PA Pager: [phone]  11/24/2014
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
## 2014-11-25 ENCOUNTER — Encounter (HOSPITAL_COMMUNITY): Payer: Self-pay | Admitting: *Deleted

## 2014-11-25 LAB — BASIC METABOLIC PANEL
ANION GAP: 7 (ref 5–15)
CHLORIDE: 101 meq/L (ref 96–112)
CO2: 29 mmol/L (ref 19–32)
Calcium: 8.6 mg/dL (ref 8.4–10.5)
Creatinine, Ser: 0.74 mg/dL (ref 0.50–1.35)
GFR calc Af Amer: >90 mL/min (ref >90)
Glucose, Bld: 115 mg/dL — ABNORMAL HIGH (ref 70–99)
POTASSIUM: 3.5 mmol/L (ref 3.5–5.1)
Sodium: 137 mmol/L (ref 135–145)

## 2014-11-25 LAB — CBC
HEMATOCRIT: 37.4 % — AB (ref 39.0–52.0)
Hemoglobin: 12.9 g/dL — ABNORMAL LOW (ref 13.0–17.0)
MCH: 29.5 pg (ref 26.0–34.0)
MCHC: 34.5 g/dL (ref 30.0–36.0)
MCV: 85.6 fL (ref 78.0–100.0)
PLATELETS: 88 10*3/uL — AB (ref 150–400)
RBC: 4.37 MIL/uL (ref 4.22–5.81)
RDW: 15.4 % (ref 11.5–15.5)
WBC: 11.3 10*3/uL — ABNORMAL HIGH (ref 4.0–10.5)

## 2014-11-25 LAB — MAGNESIUM: MAGNESIUM: 1.6 mg/dL (ref 1.5–2.5)

## 2014-11-25 LAB — GLUCOSE, CAPILLARY: Glucose-Capillary: 117 mg/dL — ABNORMAL HIGH (ref 70–99)

## 2014-11-25 MED ORDER — ENOXAPARIN SODIUM 40 MG/0.4ML ~~LOC~~ SOLN
40.0000 mg | SUBCUTANEOUS | Status: DC
  Administered 2014-11-25 – 2014-11-26 (×2): 40 mg via SUBCUTANEOUS
  Filled 2014-11-25 (×2): qty 0.4

## 2014-11-25 MED ORDER — DOCUSATE SODIUM 100 MG PO CAPS
100.0000 mg | ORAL_CAPSULE | Freq: Two times a day (BID) | ORAL | Status: DC
  Administered 2014-11-25 – 2014-11-26 (×3): 100 mg via ORAL
  Filled 2014-11-25 (×4): qty 1

## 2014-11-25 MED ORDER — DIPHENHYDRAMINE HCL 25 MG PO CAPS
25.0000 mg | ORAL_CAPSULE | Freq: Four times a day (QID) | ORAL | Status: DC | PRN
  Administered 2014-11-25: 25 mg via ORAL
  Filled 2014-11-25: qty 1

## 2014-11-25 MED ORDER — OXYCODONE HCL 5 MG PO TABS
10.0000 mg | ORAL_TABLET | ORAL | Status: DC | PRN
  Administered 2014-11-25: 10 mg via ORAL
  Administered 2014-11-25: 20 mg via ORAL
  Administered 2014-11-26: 15 mg via ORAL
  Administered 2014-11-26: 20 mg via ORAL
  Administered 2014-11-26: 15 mg via ORAL
  Administered 2014-11-26 (×2): 20 mg via ORAL
  Filled 2014-11-25 (×2): qty 3
  Filled 2014-11-25 (×4): qty 4
  Filled 2014-11-25: qty 2

## 2014-11-25 MED ORDER — POLYETHYLENE GLYCOL 3350 17 G PO PACK
17.0000 g | PACK | Freq: Every day | ORAL | Status: DC
  Filled 2014-11-25 (×2): qty 1
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
## 2014-11-25 NOTE — Progress Notes (Signed)
Subjective: 2 Days Post-Op Procedure(s) (LRB): BRACHIAL ARTERY REPAIR (Right) exploration of laceration, nerve repair x2 (Right) RELEASE FOREARM COMPARTMENT  (Right) REPAIR SUPERFICIAL FLEXOR (Right) Patient reports pain as mild.    Objective: Vital signs in last 24 hours: Temp:  [93.9 F (34.4 C)-100 F (37.8 C)] 99.1 F (37.3 C) (12/27 0722) Pulse Rate:  [80-103] 88 (12/27 0800) Resp:  [10-26] 13 (12/27 0800) BP: (111-137)/(51-90) 112/64 mmHg (12/27 0800) SpO2:  [93 %-100 %] 93 % (12/27 0800)  Intake/Output from previous day: 12/26 0701 - 12/27 0700 In: 4265.5 [P.O.:2950; I.V.:1315.5] Out: 7675 [Urine:7675] Intake/Output this shift: Total I/O In: 350 [P.O.:300; I.V.:50] Out: 300 [Urine:300]   Recent Labs  11/23/14 0602 11/23/14 0733 11/23/14 1025 11/24/14 0949 11/25/14 0237  HGB 10.2* 10.5* 12.7* 12.7* 12.9*    Recent Labs  11/24/14 0949 11/25/14 0237  WBC 11.5* 11.3*  RBC 4.29 4.37  HCT 36.1* 37.4*  PLT 75* 88*    Recent Labs  11/24/14 0949 11/25/14 0237  NA 140 137  K 3.1* 3.5  CL 107 101  CO2 26 29  BUN <5* <5*  CREATININE 0.83 0.74  GLUCOSE 89 115*  CALCIUM 7.9* 8.6    Recent Labs  11/23/14 0722 11/23/14 1025  INR 1.64* 1.44     PE-     Has finger extension with resistance and flexion. Does not speak english but denies numbness in fingertips.  Radial pulse intact. Dressing intact.   Assessment/Plan: 2 Days Post-Op Procedure(s) (LRB): BRACHIAL ARTERY REPAIR (Right) exploration of laceration, nerve repair x2 (Right) RELEASE FOREARM COMPARTMENT  (Right) REPAIR SUPERFICIAL FLEXOR (Right) Up with therapy  Karma Hiney C 11/25/2014, 9:35 AM
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
## 2014-11-26 ENCOUNTER — Encounter (HOSPITAL_COMMUNITY): Payer: Self-pay | Admitting: Vascular Surgery

## 2014-11-26 DIAGNOSIS — S65091A Other specified injury of ulnar artery at wrist and hand level of right arm, initial encounter: Secondary | ICD-10-CM | POA: Diagnosis present

## 2014-11-26 DIAGNOSIS — S648X1A Injury of other nerves at wrist and hand level of right arm, initial encounter: Secondary | ICD-10-CM | POA: Diagnosis present

## 2014-11-26 DIAGNOSIS — S55191A Other specified injury of radial artery at forearm level, right arm, initial encounter: Secondary | ICD-10-CM | POA: Diagnosis present

## 2014-11-26 LAB — TYPE AND SCREEN
ABO/RH(D): A POS
Antibody Screen: NEGATIVE
UNIT DIVISION: 0
UNIT DIVISION: 0
UNIT DIVISION: 0
UNIT DIVISION: 0
UNIT DIVISION: 0
Unit division: 0
Unit division: 0
Unit division: 0
Unit division: 0
Unit division: 0
Unit division: 0
Unit division: 0
Unit division: 0
Unit division: 0
Unit division: 0
Unit division: 0

## 2014-11-26 LAB — CBC
HEMATOCRIT: 44.9 % (ref 39.0–52.0)
HEMOGLOBIN: 15.3 g/dL (ref 13.0–17.0)
MCH: 29.5 pg (ref 26.0–34.0)
MCHC: 34.1 g/dL (ref 30.0–36.0)
MCV: 86.7 fL (ref 78.0–100.0)
Platelets: 133 10*3/uL — ABNORMAL LOW (ref 150–400)
RBC: 5.18 MIL/uL (ref 4.22–5.81)
RDW: 14.4 % (ref 11.5–15.5)
WBC: 7.3 10*3/uL (ref 4.0–10.5)

## 2014-11-26 LAB — BLOOD PRODUCT ORDER (VERBAL) VERIFICATION

## 2014-11-26 LAB — GC/CHLAMYDIA PROBE AMP
CT Probe RNA: NEGATIVE
GC Probe RNA: NEGATIVE

## 2014-11-26 MED ORDER — OXYCODONE-ACETAMINOPHEN 10-325 MG PO TABS: 1.0000 | ORAL_TABLET | ORAL | Status: DC | PRN

## 2014-11-26 NOTE — Progress Notes (Addendum)
[facility]  Subjective  - Alert, only speaks spanish.   Objective 118/69 79 98.8 F (37.1 C) (Oral) 18 100%  Intake/Output Summary (Last 24 hours) at 11/26/14 0800 Last data filed at 11/26/14 [id number]  Gross per 24 hour  Intake 593.33 ml  Output   1180 ml  Net -586.67 ml   Right radial pulse palpable AROM of all digits with slight discomfort Dressing in place   Assessment/Planning: POD #3 revascularization right upper extremity with saphenous vein graft to radial artery and ulnar artery less nerve repairs by Dr. Ophelia CharterYates  He will follow up with Dr. Hart RochesterLawson in 3 weeks post discharge Dressing and wound will be covered by Dr. Ophelia CharterYates.  Thomasena EdisCOLLINS, EMMA [facility] 11/26/2014 8:00 AM --  Laboratory Lab Results:  Recent Labs  11/24/14 0949 11/25/14 0237  WBC 11.5* 11.3*  HGB 12.7* 12.9*  HCT 36.1* 37.4*  PLT 75* 88*   BMET  Recent Labs  11/24/14 0949 11/25/14 0237  NA 140 137  K 3.1* 3.5  CL 107 101  CO2 26 29  GLUCOSE 89 115*  BUN <5* <5*  CREATININE 0.83 0.74  CALCIUM 7.9* 8.6    COAG Lab Results  Component Value Date   INR 1.44 11/23/2014   INR 1.64* 11/23/2014   INR 1.76* 11/23/2014   INR 1.90* 11/23/2014   No results found for: PTT  2-3+ radial pulse palpable Some good flexion movement of fingers with some sensation Difficult to ascertain how much sensation because of patient's language barrier  Vascular repair-saphenous vein bypass to radial and ulnar arteries seems to be intact.  We will continue to follow with you in the hospital and I will see patient 2-3 weeks post discharge in the office

## 2014-11-26 NOTE — Progress Notes (Signed)
Pt still c/o difficulty hearing.

## 2014-11-26 NOTE — Progress Notes (Signed)
Subjective: 3 Days Post-Op Procedure(s) (LRB): BRACHIAL ARTERY REPAIR (Right) exploration of laceration, nerve repair x2 (Right) RELEASE FOREARM COMPARTMENT  (Right) REPAIR SUPERFICIAL FLEXOR (Right) Patient reports pain as 8 on 0-10 scale.    Objective: Vital signs in last 24 hours: Temp:  [98.3 F (36.8 C)-99.6 F (37.6 C)] 98.8 F (37.1 C) (12/28 0500) Pulse Rate:  [79-98] 79 (12/28 0500) Resp:  [17-18] 18 (12/28 0500) BP: (110-128)/(62-75) 118/69 mmHg (12/28 0500) SpO2:  [92 %-100 %] 100 % (12/28 0500)  Intake/Output from previous day: 12/27 0701 - 12/28 0700 In: 1183.3 [P.O.:1020; I.V.:163.3] Out: 1480 [Urine:1480] Intake/Output this shift:     Recent Labs  11/23/14 1025 11/24/14 0949 11/25/14 0237  HGB 12.7* 12.7* 12.9*    Recent Labs  11/24/14 0949 11/25/14 0237  WBC 11.5* 11.3*  RBC 4.29 4.37  HCT 36.1* 37.4*  PLT 75* 88*    Recent Labs  11/24/14 0949 11/25/14 0237  NA 140 137  K 3.1* 3.5  CL 107 101  CO2 26 29  BUN <5* <5*  CREATININE 0.83 0.74  GLUCOSE 89 115*  CALCIUM 7.9* 8.6    Recent Labs  11/23/14 1025  INR 1.44    has EPL and EDC working. denies numbness to dorsal first webspace.  good flexion to all digits including FDP index and long. FPL working. good pulse.   Assessment/Plan: 3 Days Post-Op Procedure(s) (LRB): BRACHIAL ARTERY REPAIR (Right) exploration of laceration, nerve repair x2 (Right) RELEASE FOREARM COMPARTMENT  (Right) REPAIR SUPERFICIAL FLEXOR (Right) Plan:  Office followup one week. Will leave in splint until then to protect arterial grafts.  followup with me in 1 wk and Dr. Hart RochesterLawson 3 wks per his note.   Louetta Hollingshead C 11/26/2014, 9:45 AM

## 2014-11-26 NOTE — Progress Notes (Signed)
Patient ID: Curtis Weber, male   DOB: 1991-02-26, 23 y.o.   MRN: [id number]   LOS: 3 days   Subjective: Doing ok.   Objective: Vital signs in last 24 hours: Temp:  [98.3 F (36.8 C)-99.6 F (37.6 C)] 98.8 F (37.1 C) (12/28 0500) Pulse Rate:  [79-98] 79 (12/28 0500) Resp:  [17-18] 18 (12/28 0500) BP: (110-128)/(62-75) 118/69 mmHg (12/28 0500) SpO2:  [92 %-100 %] 100 % (12/28 0500) Last BM Date: 11/23/14   Physical Exam General appearance: alert and no distress Resp: clear to auscultation bilaterally Cardio: regular rate and rhythm GI: normal findings: bowel sounds normal and soft, non-tender Extremities: Warm   Assessment/Plan: RUE laceration -- per VVS and ortho ABL anemia -- Hgb stable Thrombocytopenia -- Stable, slow improvement Acute EtOH intoxication FEN -- I think pain controlled well enough on current meds VTE -- SCD's, Lovenox Dispo -- Likely home today, will get interpreter to address ENT c/o.    Freeman CaldronMichael J. Johnella Crumm, PA-C Pager: [phone] General Trauma PA Pager: [phone]  11/26/2014

## 2014-11-26 NOTE — Discharge Instructions (Signed)
No driving while taking oxycodone.  Keep splint on and dry.

## 2014-11-26 NOTE — Progress Notes (Signed)
Discharge teachings done with pt and wife via interpretor phone. Pt given prescription for percocet and match info by trauma case manager. Reiterated match program with pt and wife via interpretor phone. All questions answered
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
## 2014-11-26 NOTE — Progress Notes (Signed)
Pt enrolled in MATCH program for medication assistance.  Provided with letter, list of participating pharmacies and an explanation of the process. Pt/caregiver stated understanding.  Curtis Cerritos, RN BSN MHA CCM  Case Manager, Trauma Service/Unit 3M [phone]  

## 2014-12-24 ENCOUNTER — Encounter (HOSPITAL_COMMUNITY): Payer: Self-pay | Admitting: Emergency Medicine

## 2014-12-28 ENCOUNTER — Telehealth: Payer: Self-pay | Admitting: Vascular Surgery

## 2014-12-28 NOTE — Telephone Encounter (Signed)
LVM with brother to have patient call to schedule fu with [facility] 1st available - kf

## 2015-02-12 ENCOUNTER — Emergency Department (HOSPITAL_COMMUNITY)
Admission: EM | Admit: 2015-02-12 | Discharge: 2015-02-12 | Disposition: A | Discharge status: Home / Self Care | Payer: Self-pay | Attending: Emergency Medicine | Admitting: Emergency Medicine

## 2015-02-12 ENCOUNTER — Encounter (HOSPITAL_COMMUNITY): Payer: Self-pay | Admitting: Emergency Medicine

## 2015-02-12 ENCOUNTER — Emergency Department (HOSPITAL_COMMUNITY): Payer: Self-pay

## 2015-02-12 DIAGNOSIS — Z720 Tobacco use: Secondary | ICD-10-CM | POA: Insufficient documentation

## 2015-02-12 DIAGNOSIS — Y908 Blood alcohol level of 240 mg/100 ml or more: Secondary | ICD-10-CM | POA: Insufficient documentation

## 2015-02-12 DIAGNOSIS — R4182 Altered mental status, unspecified: Secondary | ICD-10-CM

## 2015-02-12 DIAGNOSIS — Z791 Long term (current) use of non-steroidal anti-inflammatories (NSAID): Secondary | ICD-10-CM | POA: Insufficient documentation

## 2015-02-12 DIAGNOSIS — F1010 Alcohol abuse, uncomplicated: Secondary | ICD-10-CM | POA: Insufficient documentation

## 2015-02-12 LAB — URINALYSIS, ROUTINE W REFLEX MICROSCOPIC
Bilirubin Urine: NEGATIVE
Glucose, UA: NEGATIVE mg/dL
Hgb urine dipstick: NEGATIVE
KETONES UR: NEGATIVE mg/dL
Leukocytes, UA: NEGATIVE
NITRITE: NEGATIVE
Protein, ur: NEGATIVE mg/dL
Specific Gravity, Urine: 1.003 — ABNORMAL LOW (ref 1.005–1.030)
UROBILINOGEN UA: 0.2 mg/dL (ref 0.0–1.0)
pH: 6 (ref 5.0–8.0)

## 2015-02-12 LAB — RAPID URINE DRUG SCREEN, HOSP PERFORMED
Amphetamines: NOT DETECTED
Barbiturates: NOT DETECTED
Benzodiazepines: NOT DETECTED
Cocaine: NOT DETECTED
OPIATES: NOT DETECTED
Tetrahydrocannabinol: NOT DETECTED

## 2015-02-12 LAB — CBC WITH DIFFERENTIAL/PLATELET
Basophils Absolute: 0 10*3/uL (ref 0.0–0.1)
Basophils Relative: 1 % (ref 0–1)
Eosinophils Absolute: 0.3 10*3/uL (ref 0.0–0.7)
Eosinophils Relative: 5 % (ref 0–5)
HCT: 43.4 % (ref 39.0–52.0)
Hemoglobin: 14.8 g/dL (ref 13.0–17.0)
LYMPHS ABS: 2.2 10*3/uL (ref 0.7–4.0)
LYMPHS PCT: 38 % (ref 12–46)
MCH: 30.3 pg (ref 26.0–34.0)
MCHC: 34.1 g/dL (ref 30.0–36.0)
MCV: 88.8 fL (ref 78.0–100.0)
Monocytes Absolute: 0.4 10*3/uL (ref 0.1–1.0)
Monocytes Relative: 6 % (ref 3–12)
Neutro Abs: 3 10*3/uL (ref 1.7–7.7)
Neutrophils Relative %: 50 % (ref 43–77)
Platelets: 244 10*3/uL (ref 150–400)
RBC: 4.89 MIL/uL (ref 4.22–5.81)
RDW: 12.8 % (ref 11.5–15.5)
WBC: 5.9 10*3/uL (ref 4.0–10.5)

## 2015-02-12 LAB — COMPREHENSIVE METABOLIC PANEL
ALBUMIN: 4.1 g/dL (ref 3.5–5.2)
ALT: 12 U/L (ref 0–53)
AST: 16 U/L (ref 0–37)
Alkaline Phosphatase: 59 U/L (ref 39–117)
Anion gap: 9 (ref 5–15)
BILIRUBIN TOTAL: 0.3 mg/dL (ref 0.3–1.2)
BUN: 5 mg/dL — AB (ref 6–23)
CO2: 23 mmol/L (ref 19–32)
Calcium: 8.1 mg/dL — ABNORMAL LOW (ref 8.4–10.5)
Chloride: 112 mmol/L (ref 96–112)
Creatinine, Ser: 0.7 mg/dL (ref 0.50–1.35)
GFR calc Af Amer: >90 mL/min (ref >90)
Glucose, Bld: 70 mg/dL (ref 70–99)
POTASSIUM: 3.6 mmol/L (ref 3.5–5.1)
Sodium: 144 mmol/L (ref 135–145)
TOTAL PROTEIN: 6.9 g/dL (ref 6.0–8.3)

## 2015-02-12 LAB — I-STAT CG4 LACTIC ACID, ED: Lactic Acid, Venous: 1.49 mmol/L (ref 0.5–2.0)

## 2015-02-12 LAB — I-STAT CHEM 8, ED
BUN: 3 mg/dL — AB (ref 6–23)
CREATININE: 1.1 mg/dL (ref 0.50–1.35)
Calcium, Ion: 1.04 mmol/L — ABNORMAL LOW (ref 1.12–1.23)
Chloride: 109 mmol/L (ref 96–112)
Glucose, Bld: 74 mg/dL (ref 70–99)
HCT: 45 % (ref 39.0–52.0)
Hemoglobin: 15.3 g/dL (ref 13.0–17.0)
Potassium: 3.5 mmol/L (ref 3.5–5.1)
SODIUM: 144 mmol/L (ref 135–145)
TCO2: 19 mmol/L (ref 0–100)

## 2015-02-12 LAB — BLOOD GAS, VENOUS
Acid-base deficit: 2.4 mmol/L — ABNORMAL HIGH (ref 0.0–2.0)
BICARBONATE: 21.2 meq/L (ref 20.0–24.0)
Drawn by: 409391
FIO2: 0.21 %
O2 SAT: 98 %
PATIENT TEMPERATURE: 97.9
TCO2: 18.3 mmol/L (ref 0–100)
pCO2, Ven: 34.7 mmHg — ABNORMAL LOW (ref 45.0–50.0)
pH, Ven: 7.402 — ABNORMAL HIGH (ref 7.250–7.300)
pO2, Ven: 117 mmHg — ABNORMAL HIGH (ref 30.0–45.0)

## 2015-02-12 LAB — LIPASE, BLOOD: Lipase: 20 U/L (ref 11–59)

## 2015-02-12 LAB — ACETAMINOPHEN LEVEL: Acetaminophen (Tylenol), Serum: <10 ug/mL — ABNORMAL LOW (ref 10–30)

## 2015-02-12 LAB — PROTIME-INR
INR: 1.04 (ref 0.00–1.49)
Prothrombin Time: 13.7 seconds (ref 11.6–15.2)

## 2015-02-12 LAB — CBG MONITORING, ED
GLUCOSE-CAPILLARY: 95 mg/dL (ref 70–99)
Glucose-Capillary: 67 mg/dL — ABNORMAL LOW (ref 70–99)

## 2015-02-12 LAB — SALICYLATE LEVEL: Salicylate Lvl: <4 mg/dL (ref 2.8–20.0)

## 2015-02-12 LAB — ETHANOL: Alcohol, Ethyl (B): 307 mg/dL — ABNORMAL HIGH (ref 0–9)

## 2015-02-12 MED ORDER — SODIUM CHLORIDE 0.9 % IV BOLUS (SEPSIS)
1000.0000 mL | Freq: Once | INTRAVENOUS | Status: AC
  Administered 2015-02-12: 1000 mL via INTRAVENOUS
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
## 2015-02-12 NOTE — ED Notes (Signed)
[facility] Expected date:  Expected time:  Means of arrival:  Comments: EMS 23yo M; altered menal status presistant cough

## 2015-02-12 NOTE — ED Notes (Signed)
Awake. Verbally responsive. A/O x4. Resp even and unlabored. No audible adventitious breath sounds noted. ABC's intact.  

## 2015-02-12 NOTE — ED Notes (Signed)
Pt ambulated to BR with steady gait. Denies dizziness/lightheadedness. Family at bedside.

## 2015-02-12 NOTE — ED Notes (Signed)
Encourage patient to drink orange juice due to blood sugar 67mg /dL. Pt took a few sips of orange juice. Recheck blood sugar of 95mg /dL.

## 2015-02-12 NOTE — ED Provider Notes (Signed)
CSN: [id number]     Arrival date & time 02/12/15  0210 History   First MD Initiated Contact with Patient 02/12/15 0226     Chief Complaint  Patient presents with  . Altered Mental Status     (Consider location/radiation/quality/duration/timing/severity/associated sxs/prior Treatment) HPI Curtis Weber is a 24 y.o. male with unknown past medical history presenting today by EMS. Patient is altered and unable to provide any history. Per EMS patient was found on the floor, he hyperventilated until he was lethargic. Patient was verbally responsive initially. Families told EMS that he was hearing voices earlier today and was seen at [facility] today. No further history could be obtained.  History reviewed. No pertinent past medical history. Past Surgical History  Procedure Laterality Date  . Artery repair Right 11/23/2014    Procedure: BRACHIAL ARTERY REPAIR;  Surgeon: Pryor Ochoa, MD;  Location: [facility] OR;  Service: Vascular;  Laterality: Right;  Exploration of right arm laceration with insertion of interposioninal saphenous vein graft to radial and ulnar arteries using vein from right leg.  . Tendon repair Right 11/23/2014    Procedure: exploration of laceration, nerve repair x2;  Surgeon: Pryor Ochoa, MD;  Location: [facility] OR;  Service: Vascular;  Laterality: Right;  Exploration of wound, nerve repair.    . Dorsal compartment release Right 11/23/2014    Procedure: RELEASE FOREARM COMPARTMENT ;  Surgeon: Pryor Ochoa, MD;  Location: [facility] OR;  Service: Vascular;  Laterality: Right;  . Flexor tendon repair Right 11/23/2014    Procedure: REPAIR SUPERFICIAL FLEXOR;  Surgeon: Pryor Ochoa, MD;  Location: [facility] OR;  Service: Vascular;  Laterality: Right;   History reviewed. No pertinent family history. History  Substance Use Topics  . Smoking status: Current Every Day Smoker  . Smokeless tobacco: Not on file  . Alcohol Use: Yes    Review of Systems  Unable to perform ROS: Mental  status change      Allergies  Review of patient's allergies indicates no known allergies.  Home Medications   Prior to Admission medications   Medication Sig Start Date End Date Taking? Authorizing Provider  cyclobenzaprine (FLEXERIL) 5 MG tablet Take 1 tablet (5 mg total) by mouth 3 (three) times daily as needed for muscle spasms. 11/15/13   Earley Favor, NP  HYDROcodone-acetaminophen (NORCO/VICODIN) 5-325 MG per tablet Take 1 tablet by mouth at bedtime and may repeat dose one time if needed. 11/15/13   Earley Favor, NP  ibuprofen (ADVIL,MOTRIN) 600 MG tablet Take 1 tablet (600 mg total) by mouth every 6 (six) hours as needed. 11/15/13   Earley Favor, NP  ibuprofen (ADVIL,MOTRIN) 800 MG tablet Take 1 tablet (800 mg total) by mouth 3 (three) times daily. 04/23/14   Ivonne Andrew, PA-C  oxyCODONE-acetaminophen (PERCOCET) 10-325 MG per tablet Take 1-2 tablets by mouth every 4 (four) hours as needed for pain. 11/26/14   Freeman Caldron, PA-C   BP 111/70 mmHg  Pulse 75  Temp(Src) 97.9 F (36.6 C) (Oral)  Resp 14  SpO2 98% Physical Exam  Constitutional: Vital signs are normal. He appears well-developed and well-nourished.  Non-toxic appearance. He does not appear ill. No distress.  HENT:  Head: Normocephalic and atraumatic.  Nose: Nose normal.  Mouth/Throat: Oropharynx is clear and moist. No oropharyngeal exudate.  Eyes: Conjunctivae and EOM are normal. Pupils are equal, round, and reactive to light. No scleral icterus.  Neck: Normal range of motion. Neck supple. No tracheal deviation, no edema, no erythema and normal  range of motion present. No thyroid mass and no thyromegaly present.  Cardiovascular: Normal rate, regular rhythm, S1 normal, S2 normal, normal heart sounds, intact distal pulses and normal pulses.  Exam reveals no gallop and no friction rub.   No murmur heard. Pulses:      Radial pulses are 2+ on the right side, and 2+ on the left side.       Dorsalis pedis pulses are 2+ on  the right side, and 2+ on the left side.  Pulmonary/Chest: Effort normal and breath sounds normal. No respiratory distress. He has no wheezes. He has no rhonchi. He has no rales.  Abdominal: Soft. Normal appearance and bowel sounds are normal. He exhibits no distension, no ascites and no mass. There is no hepatosplenomegaly. There is no tenderness. There is no rebound, no guarding and no CVA tenderness.  Musculoskeletal: Normal range of motion. He exhibits no edema or tenderness.  Lymphadenopathy:    He has no cervical adenopathy.  Neurological: He has normal strength. No sensory deficit.  Patient response to physical and painful stimuli. He does localize to pain. He does not follow commands. He is not verbally responsive.  Skin: Skin is warm, dry and intact. No petechiae and no rash noted. He is not diaphoretic. No erythema. No pallor.  Psychiatric: He has a normal mood and affect. His behavior is normal. Judgment normal.  Nursing note and vitals reviewed.   ED Course  Procedures (including critical care time) Labs Review Labs Reviewed  COMPREHENSIVE METABOLIC PANEL - Abnormal; Notable for the following:    BUN 5 (*)    Calcium 8.1 (*)    All other components within normal limits  URINALYSIS, ROUTINE W REFLEX MICROSCOPIC - Abnormal; Notable for the following:    Specific Gravity, Urine 1.003 (*)    All other components within normal limits  ACETAMINOPHEN LEVEL - Abnormal; Notable for the following:    Acetaminophen (Tylenol), Serum <10.0 (*)    All other components within normal limits  BLOOD GAS, VENOUS - Abnormal; Notable for the following:    pH, Ven 7.402 (*)    pCO2, Ven 34.7 (*)    pO2, Ven 117.0 (*)    Acid-base deficit 2.4 (*)    All other components within normal limits  ETHANOL - Abnormal; Notable for the following:    Alcohol, Ethyl (B) 307 (*)    All other components within normal limits  CBG MONITORING, ED - Abnormal; Notable for the following:    Glucose-Capillary  67 (*)    All other components within normal limits  I-STAT CHEM 8, ED - Abnormal; Notable for the following:    BUN 3 (*)    Calcium, Ion 1.04 (*)    All other components within normal limits  CBC WITH DIFFERENTIAL/PLATELET  LIPASE, BLOOD  PROTIME-INR  URINE RAPID DRUG SCREEN (HOSP PERFORMED)  SALICYLATE LEVEL  I-STAT CG4 LACTIC ACID, ED  CBG MONITORING, ED    Imaging Review Dg Chest 2 View  02/12/2015   CLINICAL DATA:  Altered mental status  EXAM: CHEST  2 VIEW  COMPARISON:  04/23/2014  FINDINGS: The heart size and mediastinal contours are within normal limits. Both lungs are clear. The visualized skeletal structures are unremarkable.  IMPRESSION: No active cardiopulmonary disease.   Electronically Signed   By: Ellery Plunkaniel R Mitchell M.D.   On: 02/12/2015 03:49   Ct Head Wo Contrast  02/12/2015   CLINICAL DATA:  Altered mental status  EXAM: CT HEAD WITHOUT CONTRAST  TECHNIQUE:  Contiguous axial images were obtained from the base of the skull through the vertex without intravenous contrast.  COMPARISON:  11/15/2013  FINDINGS: There is no intracranial hemorrhage, mass or evidence of acute infarction. Gray matter and white matter are normal. The ventricles and basal cisterns appear unremarkable.  The bony structures are intact. The visible portions of the paranasal sinuses are clear.  IMPRESSION: Normal brain   Electronically Signed   By: Ellery Plunk M.D.   On: 02/12/2015 04:32     EKG Interpretation   Date/Time:  Tuesday February 12 2015 02:46:14 EDT Ventricular Rate:  75 PR Interval:  185 QRS Duration: 100 QT Interval:  381 QTC Calculation: 425 R Axis:   90 Text Interpretation:  Sinus rhythm Borderline right axis deviation  Baseline wander in lead(s) V2 Confirmed by Erroll Luna [phone])  on 02/12/2015 3:24:21 AM      MDM   Final diagnoses:  Altered mental state    Patient presents emergency department for altered mental status. I do not have a very good history on  this patient thus will perform a broad workup including infectious, CT scan of head, toxicology testing.  Altered mental status workup reveals his cause being ethanol. Ethanol level is 307. Patient will be observed in the emergency department until he can ambulate on his own without assistance. Patient be signed out to oncoming provider, please see their note for ultimate disposition of this patient. As soon as he can and bring on his own without assistance the patient will be safe for discharge with primary care follow-up for alcohol cessation.  Tomasita Crumble, MD 02/12/15 [phone]

## 2015-02-12 NOTE — ED Notes (Signed)
Awake. Verbally responsive. A/O x4. Resp even and unlabored. No audible adventitious breath sounds noted. ABC's intact. SR on monitor at 89bpm. Family at bedside. IV saline lock patent and intact.

## 2015-02-12 NOTE — Discharge Instructions (Signed)
Alcohol Intoxication Alcohol intoxication occurs when you drink enough alcohol that it affects your ability to function. It can be mild or very severe. Drinking a lot of alcohol in a short time is called binge drinking. This can be very harmful. Drinking alcohol can also be more dangerous if you are taking medicines or other drugs. Some of the effects caused by alcohol may include:  Loss of coordination.  Changes in mood and behavior.  Unclear thinking.  Trouble talking (slurred speech).  Throwing up (vomiting).  Confusion.  Slowed breathing.  Twitching and shaking (seizures).  Loss of consciousness. HOME CARE  Do not drive after drinking alcohol.  Drink enough water and fluids to keep your pee (urine) clear or pale yellow. Avoid caffeine.  Only take medicine as told by your doctor. GET HELP IF:  You throw up (vomit) many times.  You do not feel better after a few days.  You frequently have alcohol intoxication. Your doctor can help decide if you should see a substance use treatment counselor. GET HELP RIGHT AWAY IF:  You become shaky when you stop drinking.  You have twitching and shaking.  You throw up blood. It may look bright red or like coffee grounds.  You notice blood in your poop (bowel movements).  You become lightheaded or pass out (faint). MAKE SURE YOU:   Understand these instructions.  Will watch your condition.  Will get help right away if you are not doing well or get worse. Document Released: 05/04/2008 Document Revised: 07/19/2013 Document Reviewed: 04/21/2013 [facility] Patient Information 2015 [place], [place]. This information is not intended to replace advice given to you by your health care provider. Make sure you discuss any questions you have with your health care provider. Intoxicacin alcohlica (Alcohol Intoxication) La intoxicacin alcohlica ocurre cuando ha bebido la cantidad de alcohol suficiente para afectar su desenvolvimiento.  Puede ser leve o muy grave. Beber gran cantidad de alcohol en un corto plazo se denomina borrachera. Puede ser [employer]. Beber alcohol tambin puede ser muy peligroso si toma medicamentos o [place] otras drogas. Algunos de los efectos causados por el alcohol son:  Prdida de la coordinacin.  Cambios en el estado de nimo y la conducta.  Pensamiento confuso.  Dificultad para hablar (arrastrar las palabras).  Devolver la comida (vomitar).  Confusin.  Disminucin de Engineer, manufacturing systems.  Sacudidas y temblores (convulsiones).  Prdida de la conciencia. CUIDADOS EN EL HOGAR  No conduzca vehculos despus de beber alcohol.  Beba gran cantidad de lquido para mantener el pis (orina) de tono claro o de color amarillo plido. Evite la cafena.  Slo tome los medicamentos que le haya indicado su mdico. SOLICITE AYUDA SI:  Devuelve (vomita) repetidas veces.  No mejora luego de [employer].  Se intoxica con alcohol con frecuencia. El mdico podr ayudarlo a decidir si debe consultar a un terapeuta especializado en el abuso de sustancias. SOLICITE AYUDA DE INMEDIATO SI:  Siente temblores cuando deja de beber.  Tiene temblores o sacudidas.  Vomita sangre. Puede ser de color rojo brillante o similar a la borra del caf.  Nota sangre en las heces (movimiento intestinal).  Se siente mareado o se desvanece (se desmaya). ASEGRESE DE QUE:   Comprende estas instrucciones.  Controlar su afeccin.  Recibir ayuda de inmediato si no mejora o si empeora. Document Released: 12/19/2010 Document Revised: 07/19/2013 [facility] Patient Information 2015 [place], [place]. This information is not intended to replace advice given to you by your health care provider. Make  sure you discuss any questions you have with your health care provider. Substance Abuse Treatment Programs  Intensive Outpatient Programs [facility]     [address]      [place], [place]                   [phone]       [facility] [address] [place] [address] [place], [place] [phone]  [facility]     (Inpatient and outpatient)     [address]           [phone]    [facility] [phone] (Suboxone and Methadone)  [address]      [place], [place] [address]      [phone]       [address] [place], [place] [phone]  [facility] (Outpatient/Inpatient, Chemical)    (insurance only) [phone]             [facility] (Groups & Residential) [place], [place] [phone]     [facility]     [address]     [place], [place]      [phone]       [facility] (for caregivers and family) [phone] [address]urell [address] [place], [place] [phone]      Residential Treatment Programs [facility]      [address] [place], [place] [address]  [phone]       [facility] [address], [place], [place] [address] [phone]  [facility] [place]        [phone]       [facility] [phone]  [facility] ([facility]             [address]                                         [place], [place]                                                [phone] or [phone]                               [facility] [place] [address] [place], [address] [phone]  [facility]    [address]      [place], [place]     [phone]       [facility] [address] [place], [place] [phone]  [facility]   [address] [place], [place] [address]     [phone]      Admissions: 8am-3pm M-F  [facility] (RTS) [address] [place], [place] [phone]  [facility]: Residential Program [phone] Days)   [place], [place]      [phone] or [phone]     [facility]: [facility] [place], [place] (Walk in Hours over the weekend or by referral)  [facility] [address] [place], [place], [place] [address] [phone]  Crisis Mobile: [facility]:  [phone] (for crisis response 24 hours a day) [facility] Hotline:  [phone] Outpatient Psychiatry and Counseling  [facility]: Mobile Crisis Management 24 hours:  [phone]  [facility] [employer] sliding scale fee and walk in schedule: M-F 8am-12pm/1pm-3pm [address]  [place], [place] [address] [phone]  [facility] [address] [place], [place] [address] [phone]  [facility] (Formerly known as [facility] [employer])- new patient walk-in appointments available Monday - Friday 8am -3pm.          [address] [place], [place] [address] [phone] or crisis line- [phone]  [facility] Out[facility] Services/ Intensive Outpatient Therapy Program [address] [place], [place] [address] [phone]  [facility] Mental Health                  Crisis Services      [phone] [address]     [place], [place] [address]                 [facility]   [facility] [phone]. [address] [place], [place] [address]   [employer] of Care          [address] Laura  [place], [place] [address]       [phone]  [facility] [address] [place], [place] [address] [phone]  [facility]    [address]    [place], [place] [address]     [phone]       Andee Poles, MD     [address]     [place] [address]     [phone]       [facility] [address] [place] [address]  [facility]     [address] [place], [place]      [phone]       [facility] Eulogio Ditch, MD [address] [place], [place] [address] [phone]  [facility]     [address]     [place], [place] [address]     [phone]       [facility] [address] [place], [place] [address] [phone] Resources for Temporary Residential Assistance/Crisis Centers  DAY CENTERS [facility]) M-F 8am-3pm   [address] [place], [place] [address]   [phone] Services include: laundry, barbering, support groups, case management, phone  & computer access, showers, AA/NA mtgs, mental health/substance abuse nurse, job skills class, disability information, VA assistance, spiritual classes, etc.   HOMELESS SHELTERS  [facility]     [facility]   [address] [place]     [phone]              [employer] (women and children)       [address] [place], [place] [address] [phone] [email or web address] for application and process Application Required  [facility]   [address]    [place] [address]     [phone]                    [facility] [place] [address] [place], [place] [address] [phone] application appt.) Application Required  [employer] (women only)    [address]     [place], [place]  [id number]     [phone]      Intake starts 6pm daily Need valid ID, SSC, & Police report [facility] [place] [address] [place], [place] [phone] Application Required  [facility] (men only)     [address]      [place], [place]     [phone]       [facility] [place] (Pregnant women only) [address] [place], [place] [phone]  [facility]      [address]      [place], [place] [address]     [phone]             [facility] [address] [place],  [place] [phone] 90 day commitment/SA/Application process  [facility](men only)     [address]     [place], [place]     [phone]       Check-in at [facility]            [facility] [address] [place], [place] [phone] Men/Women/Women and Children must be there by 7 pm  [facility] [place], [place] [phone]

## 2015-02-12 NOTE — ED Notes (Signed)
Resting quietly with eye closed. Easily arousable. Verbally responsive. Resp even and unlabored. ABC's intact. IV saline lock patent and intact. Family at bedside.  

## 2015-02-12 NOTE — ED Notes (Signed)
Pt arrived via EMS with report of finding pt on floor. Pt would hyperventilate and hold breath and repeat this several times until goes lethargic. When awaken pt was disoriented. Pt was verbal responsive upon arrival to home and done above episode and remains verbally unresponsive at this time. Pt would open his eyes and look around but would not speak. Family reported pt was hearing voices earlier today [facility].

## 2015-06-12 ENCOUNTER — Encounter (HOSPITAL_COMMUNITY): Payer: Self-pay | Admitting: Emergency Medicine

## 2015-06-12 ENCOUNTER — Emergency Department (HOSPITAL_COMMUNITY)
Admission: EM | Admit: 2015-06-12 | Discharge: 2015-06-12 | Disposition: A | Discharge status: Home / Self Care | Payer: Self-pay | Attending: Emergency Medicine | Admitting: Emergency Medicine

## 2015-06-12 DIAGNOSIS — R112 Nausea with vomiting, unspecified: Secondary | ICD-10-CM | POA: Insufficient documentation

## 2015-06-12 DIAGNOSIS — Y929 Unspecified place or not applicable: Secondary | ICD-10-CM | POA: Insufficient documentation

## 2015-06-12 DIAGNOSIS — M791 Myalgia: Secondary | ICD-10-CM | POA: Insufficient documentation

## 2015-06-12 DIAGNOSIS — R109 Unspecified abdominal pain: Secondary | ICD-10-CM | POA: Insufficient documentation

## 2015-06-12 DIAGNOSIS — T675XXA Heat exhaustion, unspecified, initial encounter: Secondary | ICD-10-CM | POA: Insufficient documentation

## 2015-06-12 DIAGNOSIS — Z720 Tobacco use: Secondary | ICD-10-CM | POA: Insufficient documentation

## 2015-06-12 DIAGNOSIS — Y9389 Activity, other specified: Secondary | ICD-10-CM | POA: Insufficient documentation

## 2015-06-12 DIAGNOSIS — Y990 Civilian activity done for income or pay: Secondary | ICD-10-CM | POA: Insufficient documentation

## 2015-06-12 DIAGNOSIS — X30XXXA Exposure to excessive natural heat, initial encounter: Secondary | ICD-10-CM | POA: Insufficient documentation

## 2015-06-12 LAB — COMPREHENSIVE METABOLIC PANEL
ALBUMIN: 4 g/dL (ref 3.5–5.0)
ALT: 11 U/L — ABNORMAL LOW (ref 17–63)
AST: 22 U/L (ref 15–41)
Alkaline Phosphatase: 61 U/L (ref 38–126)
Anion gap: 13 (ref 5–15)
BILIRUBIN TOTAL: 0.7 mg/dL (ref 0.3–1.2)
BUN: 24 mg/dL — AB (ref 6–20)
CO2: 23 mmol/L (ref 22–32)
Calcium: 8.1 mg/dL — ABNORMAL LOW (ref 8.9–10.3)
Chloride: 97 mmol/L — ABNORMAL LOW (ref 101–111)
Creatinine, Ser: 2.5 mg/dL — ABNORMAL HIGH (ref 0.61–1.24)
GFR calc non Af Amer: 35 mL/min — ABNORMAL LOW (ref >60)
GFR, EST AFRICAN AMERICAN: 40 mL/min — AB (ref >60)
GLUCOSE: 88 mg/dL (ref 65–99)
Potassium: 3.1 mmol/L — ABNORMAL LOW (ref 3.5–5.1)
Sodium: 133 mmol/L — ABNORMAL LOW (ref 135–145)
Total Protein: 6.6 g/dL (ref 6.5–8.1)

## 2015-06-12 LAB — URINALYSIS, ROUTINE W REFLEX MICROSCOPIC
BILIRUBIN URINE: NEGATIVE
GLUCOSE, UA: NEGATIVE mg/dL
Hgb urine dipstick: NEGATIVE
KETONES UR: 15 mg/dL — AB
Leukocytes, UA: NEGATIVE
Nitrite: NEGATIVE
Protein, ur: NEGATIVE mg/dL
Specific Gravity, Urine: 1.01 (ref 1.005–1.030)
Urobilinogen, UA: 0.2 mg/dL (ref 0.0–1.0)
pH: 6 (ref 5.0–8.0)

## 2015-06-12 LAB — CK: Total CK: 283 U/L (ref 49–397)

## 2015-06-12 LAB — CBC WITH DIFFERENTIAL/PLATELET
Basophils Absolute: 0 10*3/uL (ref 0.0–0.1)
Basophils Relative: 0 % (ref 0–1)
EOS ABS: 0 10*3/uL (ref 0.0–0.7)
Eosinophils Relative: 0 % (ref 0–5)
HCT: 39.9 % (ref 39.0–52.0)
Hemoglobin: 14.4 g/dL (ref 13.0–17.0)
LYMPHS PCT: 8 % — AB (ref 12–46)
Lymphs Abs: 0.7 10*3/uL (ref 0.7–4.0)
MCH: 31.6 pg (ref 26.0–34.0)
MCHC: 36.1 g/dL — ABNORMAL HIGH (ref 30.0–36.0)
MCV: 87.5 fL (ref 78.0–100.0)
Monocytes Absolute: 0.5 10*3/uL (ref 0.1–1.0)
Monocytes Relative: 6 % (ref 3–12)
NEUTROS ABS: 7 10*3/uL (ref 1.7–7.7)
Neutrophils Relative %: 86 % — ABNORMAL HIGH (ref 43–77)
PLATELETS: 251 10*3/uL (ref 150–400)
RBC: 4.56 MIL/uL (ref 4.22–5.81)
RDW: 13 % (ref 11.5–15.5)
WBC: 8.2 10*3/uL (ref 4.0–10.5)

## 2015-06-12 LAB — MAGNESIUM: Magnesium: 1.8 mg/dL (ref 1.7–2.4)

## 2015-06-12 LAB — I-STAT CG4 LACTIC ACID, ED: Lactic Acid, Venous: 1.65 mmol/L (ref 0.5–2.0)

## 2015-06-12 MED ORDER — SODIUM CHLORIDE 0.9 % IV BOLUS (SEPSIS)
1000.0000 mL | Freq: Once | INTRAVENOUS | Status: AC
Start: 2015-06-12 — End: 2015-06-12
  Administered 2015-06-12: 1000 mL via INTRAVENOUS

## 2015-06-12 NOTE — ED Provider Notes (Signed)
CSN: [id number]     Arrival date & time 06/12/15  1956 History   First MD Initiated Contact with Patient 06/12/15 1958     Chief Complaint  Patient presents with  . Spasms    The patient has been working on a hot, tar roof for 12hrs and says he has been vomiting and having spasms.  He says he did drink gatorade, water, pedilyte and a red bull this morning.     (Consider location/radiation/quality/duration/timing/severity/associated sxs/prior Treatment) HPI Comments: Patient brought to the ER by ambulance for severe muscle cramps. 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 Patient reports that today he became debilitated by severe cramps all over his body. He then developed nausea, vomiting and abdominal pain. He is brought to the emergency. By ambulance. He has received 1500 mL of IV fluids, has somewhat improved, still complaining of severe cramps.   History reviewed. No pertinent past medical history. Past Surgical History  Procedure Laterality Date  . Artery repair Right 11/23/2014    Procedure: BRACHIAL ARTERY REPAIR;  Surgeon: Pryor Ochoa, MD;  [facility];  Service: Vascular;  Laterality: Right;  Exploration of right arm laceration with insertion of interposioninal saphenous vein graft to radial and ulnar arteries using vein from right leg.  . Tendon repair Right 11/23/2014    Procedure: exploration of laceration, nerve repair x2;  Surgeon: Pryor Ochoa, MD;  [facility];  Service: Vascular;  Laterality: Right;  Exploration of wound, nerve repair.    . Dorsal compartment release Right 11/23/2014    Procedure: RELEASE FOREARM COMPARTMENT ;  Surgeon: Pryor Ochoa, MD;  [facility];  Service: Vascular;  Laterality: Right;  . Flexor tendon repair Right 11/23/2014    Procedure: REPAIR SUPERFICIAL FLEXOR;  Surgeon: Pryor Ochoa, MD;  Location: [facility] OR;  Service: Vascular;  Laterality: Right;   History reviewed. No pertinent family  history. History  Substance Use Topics  . Smoking status: Current Every Day Smoker  . Smokeless tobacco: Not on file  . Alcohol Use: Yes     Comment: occsioanlly    Review of Systems  Gastrointestinal: Positive for nausea and vomiting.  Musculoskeletal: Positive for myalgias.  All other systems reviewed and are negative.     Allergies  Review of patient's allergies indicates no known allergies.  Home Medications   Prior to Admission medications   Medication Sig Start Date End Date Taking? Authorizing Provider  cyclobenzaprine (FLEXERIL) 5 MG tablet Take 1 tablet (5 mg total) by mouth 3 (three) times daily as needed for muscle spasms. Patient not taking: Reported on 02/12/2015 11/15/13   Earley Favor, NP  HYDROcodone-acetaminophen (NORCO/VICODIN) 5-325 MG per tablet Take 1 tablet by mouth at bedtime and may repeat dose one time if needed. Patient not taking: Reported on 02/12/2015 11/15/13   Earley Favor, NP  ibuprofen (ADVIL,MOTRIN) 600 MG tablet Take 1 tablet (600 mg total) by mouth every 6 (six) hours as needed. Patient not taking: Reported on 02/12/2015 11/15/13   Earley Favor, NP  ibuprofen (ADVIL,MOTRIN) 800 MG tablet Take 1 tablet (800 mg total) by mouth 3 (three) times daily. Patient not taking: Reported on 02/12/2015 04/23/14   Ivonne Andrew, PA-C  oxyCODONE-acetaminophen (PERCOCET) 10-325 MG per tablet Take 1-2 tablets by mouth every 4 (four) hours as needed for pain. Patient not taking: Reported on 02/12/2015 11/26/14   Freeman Caldron, PA-C   BP 124/80 mmHg  Pulse 82  Temp(Src) 98.8 F (37.1 C) (Oral)  Resp 20  SpO2 96% Physical Exam  Constitutional: He is oriented to person, place, and time. He appears well-developed and well-nourished. No distress.  HENT:  Head: Normocephalic and atraumatic.  Right Ear: Hearing normal.  Left Ear: Hearing normal.  Nose: Nose normal.  Mouth/Throat: Oropharynx is clear and moist and mucous membranes are normal.  Eyes: Conjunctivae  and EOM are normal. Pupils are equal, round, and reactive to light.  Neck: Normal range of motion. Neck supple.  Cardiovascular: Regular rhythm, S1 normal and S2 normal.  Exam reveals no gallop and no friction rub.   No murmur heard. Pulmonary/Chest: Effort normal and breath sounds normal. No respiratory distress. He exhibits no tenderness.  Abdominal: Soft. Normal appearance and bowel sounds are normal. There is no hepatosplenomegaly. There is no tenderness. There is no rebound, no guarding, no tenderness at McBurney's point and negative Murphy's sign. No hernia.  Musculoskeletal: Normal range of motion.  Neurological: He is alert and oriented to person, place, and time. He has normal strength. No cranial nerve deficit or sensory deficit. Coordination normal. GCS eye subscore is 4. GCS verbal subscore is 5. GCS motor subscore is 6.  Skin: Skin is warm, dry and intact. No rash noted. No cyanosis.  Psychiatric: He has a normal mood and affect. His speech is normal and behavior is normal. Thought content normal.  Nursing note and vitals reviewed.   ED Course  Procedures (including critical care time) Labs Review Labs Reviewed  CBC WITH DIFFERENTIAL/PLATELET - Abnormal; Notable for the following:    MCHC 36.1 (*)    Neutrophils Relative % 86 (*)    Lymphocytes Relative 8 (*)    All other components within normal limits  COMPREHENSIVE METABOLIC PANEL - Abnormal; Notable for the following:    Sodium 133 (*)    Potassium 3.1 (*)    Chloride 97 (*)    BUN 24 (*)    Creatinine, Ser 2.50 (*)    Calcium 8.1 (*)    ALT 11 (*)    GFR calc non Af Amer 35 (*)    GFR calc Af Amer 40 (*)    All other components within normal limits  URINALYSIS, ROUTINE W REFLEX MICROSCOPIC (NOT AT [facility]) - Abnormal; Notable for the following:    Ketones, ur 15 (*)    All other components within normal limits  CK  MAGNESIUM  I-STAT CG4 LACTIC ACID, ED    Imaging Review No results found.   EKG  Interpretation   Date/Time:  Wednesday June 12 2015 20:04:16 EDT Ventricular Rate:  85 PR Interval:  170 QRS Duration: 97 QT Interval:  388 QTC Calculation: 461 R Axis:   93 Text Interpretation:  Age not entered, assumed to be  24 years old for  purpose of ECG interpretation Sinus rhythm Borderline right axis deviation  ST elev, probable normal early repol pattern No significant change since  last tracing Confirmed by Denine Brotz  MD, Donell Tomkins [phone]([id number]) on 06/12/2015  8:16:11 PM      MDM   Final diagnoses:  Heat exhaustion, initial encounter    Patient presented to the ER for evaluation of heat related illness. Patient has been working outside, performing roofing, in the extreme heat all week. Patient had onset of severe muscle cramps, nausea and vomiting. He was hydrated by EMS, administered fentanyl and Zofran. Hydration was aggressively continued here in the ER and he has improved. He does not have any sign of rhabdomyolysis.  Creatinine is bumped to 2.5, but he has been given 4 L of normal saline solution here in the ER. His cramping has improved and I believe he is appropriate for discharge, continue rest and oral hydration at home.    Gilda Crease, MD 06/12/15 2250

## 2015-06-12 NOTE — Discharge Instructions (Signed)
Enfermedad relacionada con Company secretary (Heat-Related Illness) Las enfermedades relacionadas con el calor ocurren cuando el cuerpo no puede enfriarse apropiadamente por s mismo. El cuerpo normalmente se enfra a travs del sudor. Sin embargo, en [employer], la sudoracin no es suficiente. En estos casos, la temperatura de una persona aumenta rpidamente. Las temperaturas muy elevadas pueden daar el cerebro u otros rganos vitales. Algunos ejemplos de enfermedades relacionadas con el calor incluyen:  Golpe de calor. Ocurre cuando el cuerpo no puede regular su temperatura. La temperatura aumenta rpidamente, el mecanismo de sudoracin falla y el cuerpo no puede enfriarse. La temperatura puede aumentar a 106F (41C) o ms en un lapso de 10 a 15 minutos. El golpe de calor puede causar la muerte o una discapacidad [place], si no se proporciona tratamiento de Associate Professor.  Agotamiento por calor. Esta es una forma ms leve de enfermedad relacionada con el calor, que puede desarrollarse despus de [employer] de exposicin a altas temperaturas, sin beber suficiente lquido. Es la respuesta del cuerpo a una prdida excesiva de agua y sal contenidas en la sudoracin.  Calambres por calor. Generalmente afectan a las personas que [place] mucho durante actividades extenuantes. Esta sudoracin drena la sal y la humedad del cuerpo. El bajo nivel de sal en los msculos causa calambres dolorosos. Los calambres por calor tambin pueden ser un sntoma de agotamiento por calor. Generalmente, ocurren en el abdomen, los brazos o las piernas. Solicite atencin mdica para los calambres, si tiene problemas cardacos o una dieta de bajo contenido de [place]. Las personas que presentan el riesgo ms alto de padecer enfermedades relacionadas con el calor son:   Ancianos.  Bebs y nios pequeos.  Personas con enfermedades mentales o crnicas.  Personas con sobrepeso (obesos).  Las Materials engineer y sanas pueden  Secondary school teacher de estas enfermedades si practican actividades fsicas extenuantes en das de [employer]. CAUSAS  Varios factores afectan la capacidad del cuerpo de enfriarse durante temperaturas extremadamente altas. Cuando hay mucha humedad, la sudoracin no se evapora tan rpidamente. Esto impide que el cuerpo libere calor rpidamente. Otros factores que pueden afectar la capacidad del cuerpo de enfriarse incluyen:   Edad.  Obesidad.  Grant Ruts.  Deshidratacin.  Enfermedad cardacas.  Enfermedad mental.  Mala circulacin.  Quemadura de sol.  Uso de medicamentos recetados.  Consumo de alcohol. SNTOMAS  Golpe de calor: Las seales de advertencia de un golpe de calor varan, aunque pueden incluir:  Una temperatura oral extremadamente alta, superior a los 103F (39,4C).  Pulso rpido y fuerte.  Mareos.  Confusin.  Piel roja, caliente y 78.  Ausencia de sudoracin.  Dolor de cabeza punzante.  Ganas de vomitar (nuseas).  Inconsciencia. Agotamiento por calor: Las seales de advertencia del agotamiento por calor incluyen:  Sudoracin abundante.  Cansancio.  Dolor de [place].  Palidez.  Debilidad.  Malestar estomacal (nuseas) o vmitos.  Calambres musculares. Calambres por calor.  Dolores o [employer]. TRATAMIENTO  Golpe de Statistician en un ambiente fro. Un lugar cerrado con aire acondicionado es lo ideal.  Beverely Risen ducha o bao fro. Pida a alguien que lo acompae para que se asegure de que usted se siente bien.  Tmese la temperatura. Asegrese de que esta baje. Agotamiento por calor  Beba abundates lquidos. No beba lquido que contenga cafena, alcohol o grandes cantidades de azcar. Estos hacen que pierda ms lquido. Tambin evite las bebidas muy fras. Pueden causarle calambres en el estmago.  Permanezca en un ambiente fro. Un lugar cerrado  con aire acondicionado es lo ideal.  Beverely Risenome una ducha o bao fro. Pida a  alguien que lo acompae para que se asegure de que usted se siente bien.  Use ropa liviana. Calambres por calor.  Interrumpa cualquier Honeywellactividad que haya estado haciendo. No intente realizar esa Consolidated Edisonactividad durante al menos 3 horas, despus de que los calambres hayan desaparecido.  Permanezca en un ambiente fro. Un lugar cerrado con aire acondicionado es lo ideal. INSTRUCCIONES PARA EL CUIDADO EN EL Devon EnergyHOGAR  Para proteger su salud cuando las temperaturas son Paul[place]muy elevadas, Maines[place] estas recomendaciones:  Mientras haga ejercicio en un ambiente caluroso, beba de dos a cuatro vasos (16 a 32onzas [0,5 a 1litros]) de lquido fresco por hora.No espere a tener sed para beber. Advertencia: Si el mdico limita su ingesta de lquido o le receta diurticos, pregntele cunto lquido debe beber cuando hace calor.  No beba lquido que contenga cafena, alcohol o grandes cantidades de azcar. Estos hacen que pierda ms lquido.  Evite las bebidas muy fras. Pueden causarle calambres en el estmago.  Use ropa apropiada. Elija prendas livianas, de colores claros y holgadas.  Si debe permanecer al Guadalupe Dawnaire libre, trate de restringir su actividad a la maana y al atardecer. Trate de descansar a menudo en lugares con sombra.  Si no est habituado a trabajar o hacer ejercicio en un ambiente caluroso, comience lentamente e incremente el ritmo en forma gradual.  Mantngase fresco en un lugar con aire acondicionado, si es posible. Si su casa no tiene aire acondicionado, vaya a Art therapistun centro comercial o biblioteca pblica.  Tomar una ducha o un bao fro puede ayudarlo a enfriarse. SOLICITE ATENCIN MDICA SI:   Siente cualquiera de los sntomas que se mencionaron anteriormente. Debe enfrentar una emergencia potencialmente mortal.  Los sntomas empeoran o duran ms de 1hora.  Los calambres por calor no mejoran en 1hora. ASEGRESE DE QUE:   Comprende estas instrucciones.  Controlar su afeccin.  Recibir ayuda  de inmediato si no mejora o si empeora. Document Released: 09/06/2013 [facility] Patient Information 2015 [place], [place]. This information is not intended to replace advice given to you by your health care provider. Make sure you discuss any questions you have with your health care provider.

## 2015-06-12 NOTE — ED Notes (Addendum)
The patient has been working on a hot, tar roof for 12hrs and says he has been vomiting and having spasms.  He says he did drink gatorade, water, pedilyte and a red bull this morning.  The patient was in full body muscles spasms when EMS got there.  The family called EMS and advised them they had bathed him in alcohol to try to remedie the spasms.  EMS placed an IV gave him 200mcg of Fentanyl, 4mg  of Zofran, and liters of Saline.  He is alert and oriented x4 follwing commands.     He rates his pain 8/10.  Patient did say he feels dizzy.

## 2015-06-12 NOTE — ED Notes (Signed)
Discharge instructions reviewed by Spanish Int [id number]  Patient did not have any questions per int.  Instructed to drink plenty of water and gator ade

## 2016-09-09 ENCOUNTER — Encounter (HOSPITAL_COMMUNITY): Payer: Self-pay

## 2016-09-09 ENCOUNTER — Emergency Department (HOSPITAL_COMMUNITY)
Admission: EM | Admit: 2016-09-09 | Discharge: 2016-09-09 | Disposition: A | Discharge status: Home / Self Care | Payer: Self-pay | Attending: Emergency Medicine | Admitting: Emergency Medicine

## 2016-09-09 DIAGNOSIS — F419 Anxiety disorder, unspecified: Secondary | ICD-10-CM | POA: Insufficient documentation

## 2016-09-09 DIAGNOSIS — F1010 Alcohol abuse, uncomplicated: Secondary | ICD-10-CM

## 2016-09-09 DIAGNOSIS — F17200 Nicotine dependence, unspecified, uncomplicated: Secondary | ICD-10-CM | POA: Insufficient documentation

## 2016-09-09 DIAGNOSIS — F10120 Alcohol abuse with intoxication, uncomplicated: Secondary | ICD-10-CM | POA: Insufficient documentation

## 2016-09-09 NOTE — ED Notes (Signed)
Patient refused lab work. RN made aware. 

## 2016-09-09 NOTE — ED Notes (Signed)
Pt states that he feels better.

## 2016-09-09 NOTE — ED Triage Notes (Signed)
Pt states that he has been drinking beer since yesterday. States that today he walked to the store and when he got back, he had a panic attack. Denies pain. Pt speaks Spanish. Ambulatory.

## 2016-09-09 NOTE — ED Notes (Signed)
Patient is requesting to leave and reports family is outside. No signs of family. Dr. Edward Jolly. Curtis Weber has came to evaluate patient.

## 2016-09-09 NOTE — ED Provider Notes (Signed)
[facility]-EMERGENCY DEPT Provider Note   CSN: [id number] Arrival date & time: 09/09/16  1814     History   Chief Complaint Chief Complaint  Patient presents with  . Alcohol Intoxication  . Panic Attack    HPI Curtis Weber is a 25 y.o. male.  25 yo M with a chief complaint of alcohol intoxication. This been an ongoing problem.  Patient now would like to quit.  While waiting he no longer wants to be seen, denies SI/HI.    The history is provided by the patient.  Mental Health Problem  Presenting symptoms: depression   Degree of incapacity (severity):  Moderate Onset quality:  Sudden Duration:  2 days Timing:  Constant Progression:  Unchanged Chronicity:  New Context: alcohol use   Treatment compliance:  Untreated Relieved by:  Nothing Worsened by:  Nothing Ineffective treatments:  None tried Associated symptoms: no abdominal pain, no chest pain and no headaches     History reviewed. No pertinent past medical history.  Patient Active Problem List   Diagnosis Date Noted  . Inj radial artery at forearm level, right arm, init encntr 11/26/2014  . Inj ulnar artery at wrist and hand level of right arm, init 11/26/2014  . Injury of nerves at wrist and hand level of right arm, init 11/26/2014  . Acute respiratory failure (HCC) 11/24/2014  . Acute alcohol intoxication (HCC) 11/24/2014  . Acute blood loss anemia 11/24/2014  . Arm laceration involving tendon 11/23/2014    Past Surgical History:  Procedure Laterality Date  . ARTERY REPAIR Right 11/23/2014   Procedure: BRACHIAL ARTERY REPAIR;  Surgeon: Pryor Ochoa, MD;  Location: [facility] OR;  Service: Vascular;  Laterality: Right;  Exploration of right arm laceration with insertion of interposioninal saphenous vein graft to radial and ulnar arteries using vein from right leg.  . DORSAL COMPARTMENT RELEASE Right 11/23/2014   Procedure: RELEASE FOREARM COMPARTMENT ;  Surgeon: Pryor Ochoa, MD;  Location: [facility];  Service:  Vascular;  Laterality: Right;  . FLEXOR TENDON REPAIR Right 11/23/2014   Procedure: REPAIR SUPERFICIAL FLEXOR;  Surgeon: Pryor Ochoa, MD;  Location: [facility] OR;  Service: Vascular;  Laterality: Right;  . TENDON REPAIR Right 11/23/2014   Procedure: exploration of laceration, nerve repair x2;  Surgeon: Pryor Ochoa, MD;  Location: [facility] OR;  Service: Vascular;  Laterality: Right;  Exploration of wound, nerve repair.         Home Medications    Prior to Admission medications   Medication Sig Start Date End Date Taking? Authorizing Provider  cyclobenzaprine (FLEXERIL) 5 MG tablet Take 1 tablet (5 mg total) by mouth 3 (three) times daily as needed for muscle spasms. Patient not taking: Reported on 02/12/2015 11/15/13   Earley Favor, NP  HYDROcodone-acetaminophen (NORCO/VICODIN) 5-325 MG per tablet Take 1 tablet by mouth at bedtime and may repeat dose one time if needed. Patient not taking: Reported on 02/12/2015 11/15/13   Earley Favor, NP  ibuprofen (ADVIL,MOTRIN) 600 MG tablet Take 1 tablet (600 mg total) by mouth every 6 (six) hours as needed. Patient not taking: Reported on 02/12/2015 11/15/13   Earley Favor, NP  ibuprofen (ADVIL,MOTRIN) 800 MG tablet Take 1 tablet (800 mg total) by mouth 3 (three) times daily. Patient not taking: Reported on 02/12/2015 04/23/14   Ivonne Andrew, PA-C  oxyCODONE-acetaminophen (PERCOCET) 10-325 MG per tablet Take 1-2 tablets by mouth every 4 (four) hours as needed for pain. Patient not taking: Reported on 02/12/2015 11/26/14   Nolon Bussing  Margaretha GlassingJeffery, PA-C    Family History History reviewed. No pertinent family history.  Social History Social History  Substance Use Topics  . Smoking status: Current Every Day Smoker  . Smokeless tobacco: Not on file  . Alcohol use Yes     Comment: occsioanlly     Allergies   Review of patient's allergies indicates no known allergies.   Review of Systems Review of Systems  Constitutional: Negative for chills and fever.  HENT:  Negative for congestion and facial swelling.   Eyes: Negative for discharge and visual disturbance.  Respiratory: Negative for shortness of breath.   Cardiovascular: Negative for chest pain and palpitations.  Gastrointestinal: Negative for abdominal pain, diarrhea and vomiting.  Musculoskeletal: Negative for arthralgias and myalgias.  Skin: Negative for color change and rash.  Neurological: Negative for tremors, syncope and headaches.  Psychiatric/Behavioral: Negative for confusion and dysphoric mood.     Physical Exam Updated Vital Signs BP 108/69 (BP Location: Right Arm)   Pulse 89   Temp 98.2 F (36.8 C) (Oral)   Resp 16   SpO2 97%   Physical Exam  Constitutional: He is oriented to person, place, and time. He appears well-developed and well-nourished.  HENT:  Head: Normocephalic and atraumatic.  Eyes: EOM are normal. Pupils are equal, round, and reactive to light.  Neck: Normal range of motion. Neck supple. No JVD present.  Cardiovascular: Normal rate and regular rhythm.  Exam reveals no gallop and no friction rub.   No murmur heard. Pulmonary/Chest: No respiratory distress. He has no wheezes.  Abdominal: He exhibits no distension. There is no rebound and no guarding.  Musculoskeletal: Normal range of motion.  Neurological: He is alert and oriented to person, place, and time.  Skin: No rash noted. No pallor.  Psychiatric: He has a normal mood and affect. His behavior is normal.  Nursing note and vitals reviewed.    ED Treatments / Results  Labs (all labs ordered are listed, but only abnormal results are displayed) Labs Reviewed - No data to display  EKG  EKG Interpretation None       Radiology No results found.  Procedures Procedures (including critical care time)  Medications Ordered in ED Medications - No data to display   Initial Impression / Assessment and Plan / ED Course  I have reviewed the triage vital signs and the nursing notes.  Pertinent  labs & imaging results that were available during my care of the patient were reviewed by me and considered in my medical decision making (see chart for details).  Clinical Course    25 yo M With a chief complaint of alcohol intoxication. Patient was also having panic attacks though no longer feels like he needs medical attention. He denies suicidal or homicidal ideation denies hallucinations. Discharge home.  12:37 AM:  I have discussed the diagnosis/risks/treatment options with the patient and family and believe the pt to be eligible for discharge home to follow-up with PCP. We also discussed returning to the ED immediately if new or worsening sx occur. We discussed the sx which are most concerning (e.g., sudden worsening pain, fever, inability to tolerate by mouth) that necessitate immediate return. Medications administered to the patient during their visit and any new prescriptions provided to the patient are listed below.  Medications given during this visit Medications - No data to display   The patient appears reasonably screen and/or stabilized for discharge and I doubt any other medical condition or other [facility] requiring further screening, evaluation,  or treatment in the ED at this time prior to discharge.    Final Clinical Impressions(s) / ED Diagnoses   Final diagnoses:  Alcohol abuse  Anxiety    New Prescriptions Discharge Medication List as of 09/09/2016  7:55 PM       Melene Plan, DO 09/10/16 0037

## 2016-09-09 NOTE — ED Notes (Signed)
Removed at 16g IV from Left AC. Catheter is intact. Applied guaze and tape over site. Bleeding controlled. No signs of infection noted.

## 2016-09-09 NOTE — ED Notes (Signed)
Bed: WTR9 Expected date:  Expected time:  Means of arrival:  Comments: 

## 2020-04-19 ENCOUNTER — Emergency Department (HOSPITAL_COMMUNITY)
Admission: EM | Admit: 2020-04-19 | Discharge: 2020-04-19 | Disposition: A | Discharge status: Home / Self Care | Payer: Self-pay | Attending: Emergency Medicine | Admitting: Emergency Medicine

## 2020-04-19 ENCOUNTER — Encounter (HOSPITAL_COMMUNITY): Payer: Self-pay | Admitting: Emergency Medicine

## 2020-04-19 ENCOUNTER — Other Ambulatory Visit: Payer: Self-pay

## 2020-04-19 ENCOUNTER — Emergency Department (HOSPITAL_COMMUNITY): Payer: Self-pay

## 2020-04-19 DIAGNOSIS — R42 Dizziness and giddiness: Secondary | ICD-10-CM | POA: Insufficient documentation

## 2020-04-19 DIAGNOSIS — R0602 Shortness of breath: Secondary | ICD-10-CM | POA: Insufficient documentation

## 2020-04-19 DIAGNOSIS — R0789 Other chest pain: Secondary | ICD-10-CM | POA: Insufficient documentation

## 2020-04-19 DIAGNOSIS — F17210 Nicotine dependence, cigarettes, uncomplicated: Secondary | ICD-10-CM | POA: Insufficient documentation

## 2020-04-19 DIAGNOSIS — R079 Chest pain, unspecified: Secondary | ICD-10-CM

## 2020-04-19 DIAGNOSIS — F1510 Other stimulant abuse, uncomplicated: Secondary | ICD-10-CM | POA: Insufficient documentation

## 2020-04-19 DIAGNOSIS — R9431 Abnormal electrocardiogram [ECG] [EKG]: Secondary | ICD-10-CM | POA: Insufficient documentation

## 2020-04-19 DIAGNOSIS — R002 Palpitations: Secondary | ICD-10-CM | POA: Insufficient documentation

## 2020-04-19 LAB — URINALYSIS, ROUTINE W REFLEX MICROSCOPIC
Bacteria, UA: NONE SEEN
Bilirubin Urine: NEGATIVE
Glucose, UA: 50 mg/dL — AB
Hgb urine dipstick: NEGATIVE
Ketones, ur: NEGATIVE mg/dL
Leukocytes,Ua: NEGATIVE
Nitrite: NEGATIVE
Protein, ur: 30 mg/dL — AB
Specific Gravity, Urine: 1.024 (ref 1.005–1.030)
pH: 6 (ref 5.0–8.0)

## 2020-04-19 LAB — CBG MONITORING, ED: Glucose-Capillary: 92 mg/dL (ref 70–99)

## 2020-04-19 LAB — CBC
HCT: 42.4 % (ref 39.0–52.0)
Hemoglobin: 14.5 g/dL (ref 13.0–17.0)
MCH: 32.2 pg (ref 26.0–34.0)
MCHC: 34.2 g/dL (ref 30.0–36.0)
MCV: 94 fL (ref 80.0–100.0)
Platelets: 244 10*3/uL (ref 150–400)
RBC: 4.51 MIL/uL (ref 4.22–5.81)
RDW: 12.2 % (ref 11.5–15.5)
WBC: 6.1 10*3/uL (ref 4.0–10.5)
nRBC: 0 % (ref 0.0–0.2)

## 2020-04-19 LAB — RAPID URINE DRUG SCREEN, HOSP PERFORMED
Amphetamines: POSITIVE — AB
Barbiturates: NOT DETECTED
Benzodiazepines: NOT DETECTED
Cocaine: NOT DETECTED
Opiates: NOT DETECTED
Tetrahydrocannabinol: NOT DETECTED

## 2020-04-19 LAB — BASIC METABOLIC PANEL
Anion gap: 12 (ref 5–15)
BUN: 10 mg/dL (ref 6–20)
CO2: 25 mmol/L (ref 22–32)
Calcium: 9.2 mg/dL (ref 8.9–10.3)
Chloride: 100 mmol/L (ref 98–111)
Creatinine, Ser: 0.89 mg/dL (ref 0.61–1.24)
GFR calc Af Amer: >60 mL/min (ref >60)
GFR calc non Af Amer: >60 mL/min (ref >60)
Glucose, Bld: 115 mg/dL — ABNORMAL HIGH (ref 70–99)
Potassium: 3.5 mmol/L (ref 3.5–5.1)
Sodium: 137 mmol/L (ref 135–145)

## 2020-04-19 LAB — TROPONIN I (HIGH SENSITIVITY)
Troponin I (High Sensitivity): <2 ng/L (ref <18)
Troponin I (High Sensitivity): 3 ng/L (ref <18)

## 2020-04-19 MED ORDER — SODIUM CHLORIDE 0.9% FLUSH: 3.0000 mL | Freq: Once | INTRAVENOUS | Status: DC

## 2020-04-19 MED ORDER — SODIUM CHLORIDE 0.9 % IV BOLUS
500.0000 mL | Freq: Once | INTRAVENOUS | Status: AC
  Administered 2020-04-19: 500 mL via INTRAVENOUS

## 2020-04-19 NOTE — ED Triage Notes (Signed)
Patient in POV, presents with multiple complaints. Reports chest tightness, SOB, dizziness, hand cramping that started approx 2hrs PTA. Patient went to clinic and checked his BP - noted to be elevated so he came here for eval. Patient in NAD.

## 2020-04-19 NOTE — ED Notes (Signed)
Discharge instructions discussed with pt. Pt verbalized understanding with no questions at this time. Pt to go home with friend 
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
## 2020-04-19 NOTE — Discharge Instructions (Addendum)
Please follow up with [facility] and Wellness for primary care needs. I have also placed a referral to the cardiologist (heart doctor) - they will call to schedule an appointment.   Drink plenty of water over the next few days to help flush out anything in your system. Refrain from excessive caffeine use including energy drinks and sodas.   Return to the ED for any worsening symptoms

## 2020-04-19 NOTE — ED Notes (Signed)
CBG Results of 92 reported to Phill,RN.

## 2020-04-28 ENCOUNTER — Encounter (HOSPITAL_COMMUNITY): Payer: Self-pay | Admitting: Emergency Medicine

## 2020-04-28 ENCOUNTER — Emergency Department (HOSPITAL_COMMUNITY): Payer: Self-pay

## 2020-04-28 ENCOUNTER — Other Ambulatory Visit: Payer: Self-pay

## 2020-04-28 ENCOUNTER — Emergency Department (HOSPITAL_COMMUNITY)
Admission: EM | Admit: 2020-04-28 | Discharge: 2020-04-28 | Disposition: A | Discharge status: Home / Self Care | Payer: Self-pay | Attending: Emergency Medicine | Admitting: Emergency Medicine

## 2020-04-28 DIAGNOSIS — R079 Chest pain, unspecified: Secondary | ICD-10-CM

## 2020-04-28 DIAGNOSIS — R0789 Other chest pain: Secondary | ICD-10-CM | POA: Insufficient documentation

## 2020-04-28 DIAGNOSIS — R0609 Other forms of dyspnea: Secondary | ICD-10-CM | POA: Insufficient documentation

## 2020-04-28 DIAGNOSIS — F17210 Nicotine dependence, cigarettes, uncomplicated: Secondary | ICD-10-CM | POA: Insufficient documentation

## 2020-04-28 LAB — BASIC METABOLIC PANEL
Anion gap: 13 (ref 5–15)
BUN: 9 mg/dL (ref 6–20)
CO2: 24 mmol/L (ref 22–32)
Calcium: 8.8 mg/dL — ABNORMAL LOW (ref 8.9–10.3)
Chloride: 106 mmol/L (ref 98–111)
Creatinine, Ser: 0.99 mg/dL (ref 0.61–1.24)
GFR calc Af Amer: >60 mL/min (ref >60)
GFR calc non Af Amer: >60 mL/min (ref >60)
Glucose, Bld: 89 mg/dL (ref 70–99)
Potassium: 4.2 mmol/L (ref 3.5–5.1)
Sodium: 143 mmol/L (ref 135–145)

## 2020-04-28 LAB — CBC
HCT: 44.6 % (ref 39.0–52.0)
Hemoglobin: 14.9 g/dL (ref 13.0–17.0)
MCH: 32.3 pg (ref 26.0–34.0)
MCHC: 33.4 g/dL (ref 30.0–36.0)
MCV: 96.7 fL (ref 80.0–100.0)
Platelets: 259 10*3/uL (ref 150–400)
RBC: 4.61 MIL/uL (ref 4.22–5.81)
RDW: 12.8 % (ref 11.5–15.5)
WBC: 5.8 10*3/uL (ref 4.0–10.5)
nRBC: 0 % (ref 0.0–0.2)

## 2020-04-28 LAB — TROPONIN I (HIGH SENSITIVITY)
Troponin I (High Sensitivity): <2 ng/L (ref <18)
Troponin I (High Sensitivity): <2 ng/L (ref <18)

## 2020-04-28 MED ORDER — SODIUM CHLORIDE 0.9% FLUSH: 3.0000 mL | Freq: Once | INTRAVENOUS | Status: DC

## 2020-04-28 NOTE — ED Triage Notes (Signed)
Pt to triage via [facility].  C/o L sided chest pain x 1 hour.  Denies SOB, nausea, and vomiting.  EMS administered ASA 324mg .

## 2020-04-30 NOTE — ED Provider Notes (Signed)
[facility] EMERGENCY DEPARTMENT Provider Note   CSN: [id number] Arrival date & time: 04/28/20  1533     History Chief Complaint  Patient presents with  . Chest Pain    Curtis Weber is a 29 y.o. male.  HPI   29 year old male with chest tightness and dyspnea.  Onset earlier today.  He was walking in the mall with his son when symptoms began.  Currently improved.  He said the symptoms intermittently over the past several months.  He has been seen in the emergency room recently for similar.  No fevers or chills.  No unusual leg pain or swelling.  No fevers or chills.  No significant past medical history but does not have a routine medical care.  Does drink alcohol regularly. Primarily Spanish-speaking.  Interpreter service used.  History reviewed. No pertinent past medical history.  Patient Active Problem List   Diagnosis Date Noted  . Inj radial artery at forearm level, right arm, init encntr 11/26/2014  . Inj ulnar artery at wrist and hand level of right arm, init 11/26/2014  . Injury of nerves at wrist and hand level of right arm, init 11/26/2014  . Acute respiratory failure ([place]) 11/24/2014  . Acute alcohol intoxication ([place]) 11/24/2014  . Acute blood loss anemia 11/24/2014  . Arm laceration involving tendon 11/23/2014    Past Surgical History:  Procedure Laterality Date  . ARTERY REPAIR Right 11/23/2014   Procedure: BRACHIAL ARTERY REPAIR;  Surgeon: Mal Misty, MD;  Location: [facility] OR;  Service: Vascular;  Laterality: Right;  Exploration of right arm laceration with insertion of interposioninal saphenous vein graft to radial and ulnar arteries using vein from right leg.  . DORSAL COMPARTMENT RELEASE Right 11/23/2014   Procedure: RELEASE FOREARM COMPARTMENT ;  Surgeon: Mal Misty, MD;  Location: [facility];  Service: Vascular;  Laterality: Right;  . FLEXOR TENDON REPAIR Right 11/23/2014   Procedure: REPAIR SUPERFICIAL FLEXOR;  Surgeon: Mal Misty, MD;  Location: [facility];  Service: Vascular;  Laterality: Right;  . TENDON REPAIR Right 11/23/2014   Procedure: exploration of laceration, nerve repair x2;  Surgeon: Mal Misty, MD;  Location: [facility];  Service: Vascular;  Laterality: Right;  Exploration of wound, nerve repair.         No family history on file.  Social History   Tobacco Use  . Smoking status: Current Every Day Smoker    Packs/day: 0.50    Types: Cigarettes  . Smokeless tobacco: Never Used  Substance Use Topics  . Alcohol use: Yes    Comment: Socially   . Drug use: No    Home Medications Prior to Admission medications   Medication Sig Start Date End Date Taking? Authorizing Provider  cyclobenzaprine (FLEXERIL) 5 MG tablet Take 1 tablet (5 mg total) by mouth 3 (three) times daily as needed for muscle spasms. Patient not taking: Reported on 02/12/2015 11/15/13   Junius Creamer, NP  HYDROcodone-acetaminophen (NORCO/VICODIN) 5-325 MG per tablet Take 1 tablet by mouth at bedtime and may repeat dose one time if needed. Patient not taking: Reported on 02/12/2015 11/15/13   Junius Creamer, NP  ibuprofen (ADVIL,MOTRIN) 600 MG tablet Take 1 tablet (600 mg total) by mouth every 6 (six) hours as needed. Patient not taking: Reported on 02/12/2015 11/15/13   Junius Creamer, NP  ibuprofen (ADVIL,MOTRIN) 800 MG tablet Take 1 tablet (800 mg total) by mouth 3 (three) times daily. Patient not taking: Reported on 02/12/2015 04/23/14   Dammen,  Theron Arista, PA-C  oxyCODONE-acetaminophen (PERCOCET) 10-325 MG per tablet Take 1-2 tablets by mouth every 4 (four) hours as needed for pain. Patient not taking: Reported on 02/12/2015 11/26/14   Freeman Caldron, PA-C    Allergies    Patient has no known allergies.  Review of Systems   Review of Systems All systems reviewed and negative, other than as noted in HPI.  Physical Exam Updated Vital Signs BP (!) 145/104   Pulse 87   Temp 99 F (37.2 C) (Oral)   Resp 16   SpO2 100%   Physical  Exam Vitals and nursing note reviewed.  Constitutional:      General: He is not in acute distress.    Appearance: He is well-developed.  HENT:     Head: Normocephalic and atraumatic.  Eyes:     General:        Right eye: No discharge.        Left eye: No discharge.     Conjunctiva/sclera: Conjunctivae normal.  Cardiovascular:     Rate and Rhythm: Normal rate and regular rhythm.     Heart sounds: Normal heart sounds. No murmur. No friction rub. No gallop.   Pulmonary:     Effort: Pulmonary effort is normal. No respiratory distress.     Breath sounds: Normal breath sounds.  Abdominal:     General: There is no distension.     Palpations: Abdomen is soft.     Tenderness: There is no abdominal tenderness.  Musculoskeletal:        General: No tenderness.     Cervical back: Neck supple.  Skin:    General: Skin is warm and dry.  Neurological:     Mental Status: He is alert.  Psychiatric:        Behavior: Behavior normal.        Thought Content: Thought content normal.     ED Results / Procedures / Treatments   Labs (all labs ordered are listed, but only abnormal results are displayed) Labs Reviewed  BASIC METABOLIC PANEL - Abnormal; Notable for the following components:      Result Value   Calcium 8.8 (*)    All other components within normal limits  CBC  TROPONIN I (HIGH SENSITIVITY)  TROPONIN I (HIGH SENSITIVITY)    EKG EKG Interpretation  Date/Time:  Sunday Apr 28 2020 15:29:17 EDT Ventricular Rate:  78 PR Interval:  146 QRS Duration: 94 QT Interval:  356 QTC Calculation: 405 R Axis:   93 Text Interpretation: Normal sinus rhythm Rightward axis Borderline ECG Confirmed by Raeford Razor [phone]) on 04/28/2020 6:03:51 PM   Radiology No results found.   DG Chest 2 View  Result Date: 04/28/2020 CLINICAL DATA:  Chest pain EXAM: CHEST - 2 VIEW COMPARISON:  Chest radiograph 04/19/2020 FINDINGS: The heart size and mediastinal contours are within normal limits. The  lungs are clear. No pneumothorax or pleural effusion. The visualized skeletal structures are unremarkable. IMPRESSION: No acute cardiopulmonary process. Electronically Signed   By: Emmaline Kluver M.D.   On: 04/28/2020 16:06   DG Chest 2 View  Result Date: 04/19/2020 CLINICAL DATA:  Chest tightness, shortness of breath, dizziness EXAM: CHEST - 2 VIEW COMPARISON:  02/12/2015 FINDINGS: The heart size and mediastinal contours are within normal limits. Both lungs are clear. The visualized skeletal structures are unremarkable. IMPRESSION: No active cardiopulmonary disease. Electronically Signed   By: Sharlet Salina M.D.   On: 04/19/2020 16:13    Procedures Procedures (including critical care  time)  Medications Ordered in ED Medications - No data to display  ED Course  I have reviewed the triage vital signs and the nursing notes.  Pertinent labs & imaging results that were available during my care of the patient were reviewed by me and considered in my medical decision making (see chart for details).    MDM Rules/Calculators/A&P                      28yM with CP. Seems atypical for ACS. Doubt PE, dissection or other emergent process. Needs to establish outpt FU.   Final Clinical Impression(s) / ED Diagnoses Final diagnoses:  Nonspecific chest pain    Rx / DC Orders ED Discharge Orders    None       Raeford Razor, MD 04/30/20 1739

## 2020-05-07 ENCOUNTER — Other Ambulatory Visit: Payer: Self-pay

## 2020-05-07 ENCOUNTER — Encounter: Payer: Self-pay | Admitting: Internal Medicine

## 2020-05-07 ENCOUNTER — Ambulatory Visit (INDEPENDENT_AMBULATORY_CARE_PROVIDER_SITE_OTHER): Payer: Self-pay | Admitting: Internal Medicine

## 2020-05-07 DIAGNOSIS — R072 Precordial pain: Secondary | ICD-10-CM

## 2020-05-07 MED ORDER — METOPROLOL TARTRATE 50 MG PO TABS
100.0000 mg | ORAL_TABLET | Freq: Once | ORAL | 0 refills | Status: AC
Start: 2020-05-07 — End: 2020-05-07

## 2020-05-07 NOTE — Progress Notes (Signed)
Cardiology Office Note:    Date:  05/07/2020   ID:  Curtis Weber, DOB 11-Oct-1991, MRN [id number]  PCP:  Patient, No Pcp Per  Cardiologist:  No primary care provider on file.  Electrophysiologist:  None   Referring MD: Eustaquio Maize, PA-C   Chief Complaint: Chest pain  History of Present Illness:    Curtis Weber is a 29 y.o. male with no signif PMHx.   Visit assisted by Spanish interpreter Anderson Malta.   Chest pain, pressure on chest. Worse with activity. Occurs at rest as well.  Associated with BP going up. (150 mmHg in ER). Occurring for 2 years. Left sided chest pressure- gets better with laying down or sitting. Working a lot makes it come on, and talking a lot. If he cools off and pours water on his shirt it gets better. Hangs dry wall at work, makes chest pain worse. At work on break will feel dizzy and shaky, can feel his BP go up.   Nosebleeds monthly. Daily headaches for 2 years.   Occasional smoking - 4-5 years.   Strong family history of MI, probable premature CAD - 4 aunts with heart attacks and 2 cousins, in their 18s.   Denies SOB, palpitations, leg swelling, syncope.  No past medical history on file.  Past Surgical History:  Procedure Laterality Date  . ARTERY REPAIR Right 11/23/2014   Procedure: BRACHIAL ARTERY REPAIR;  Surgeon: Mal Misty, MD;  Location: [facility] OR;  Service: Vascular;  Laterality: Right;  Exploration of right arm laceration with insertion of interposioninal saphenous vein graft to radial and ulnar arteries using vein from right leg.  . DORSAL COMPARTMENT RELEASE Right 11/23/2014   Procedure: RELEASE FOREARM COMPARTMENT ;  Surgeon: Mal Misty, MD;  Location: [place];  Service: Vascular;  Laterality: Right;  . FLEXOR TENDON REPAIR Right 11/23/2014   Procedure: REPAIR SUPERFICIAL FLEXOR;  Surgeon: Mal Misty, MD;  Location: [place];  Service: Vascular;  Laterality: Right;  . TENDON REPAIR Right 11/23/2014   Procedure:  exploration of laceration, nerve repair x2;  Surgeon: Mal Misty, MD;  Location: [place];  Service: Vascular;  Laterality: Right;  Exploration of wound, nerve repair.      Current Medications: No outpatient medications have been marked as taking for the 05/07/20 encounter (Office Visit) with Elouise Munroe, MD.     Allergies:   Patient has no known allergies.   Social History   Socioeconomic History  . Marital status: Single    Spouse name: Not on file  . Number of children: Not on file  . Years of education: Not on file  . Highest education level: Not on file  Occupational History  . Not on file  Tobacco Use  . Smoking status: Current Every Day Smoker    Packs/day: 0.50    Types: Cigarettes  . Smokeless tobacco: Never Used  Substance and Sexual Activity  . Alcohol use: Yes    Comment: Socially   . Drug use: No  . Sexual activity: Not on file  Other Topics Concern  . Not on file  Social History Narrative   ** Merged History Encounter **       Social Determinants of Health   Financial Resource Strain:   . Difficulty of Paying Living Expenses:   Food Insecurity:   . Worried About Charity fundraiser in the Last Year:   . Arboriculturist in the Last Year:   Transportation Needs:   .  Lack of Transportation (Medical):   Marland Kitchen Lack of Transportation (Non-Medical):   Physical Activity:   . Days of Exercise per Week:   . Minutes of Exercise per Session:   Stress:   . Feeling of Stress :   Social Connections:   . Frequency of Communication with Friends and Family:   . Frequency of Social Gatherings with Friends and Family:   . Attends Religious Services:   . Active Member of Clubs or Organizations:   . Attends Archivist Meetings:   Marland Kitchen Marital Status:      Family History: The patient's family history is not on file.  ROS:   Please see the history of present illness.    All other systems reviewed and are negative.  EKGs/Labs/Other Studies Reviewed:     The following studies were reviewed today:  EKG:  NSR, rightward axis - ECG from 04/28/20 in ED  I have independently reviewed the images from Chest Xray 04/28/20 - no acute findings.  Recent Labs: 04/28/2020: BUN 9; Creatinine, Ser 0.99; Hemoglobin 14.9; Platelets 259; Potassium 4.2; Sodium 143  Recent Lipid Panel    Component Value Date/Time   TRIG 68 11/23/2014 1025    Physical Exam:    VS:  BP 118/80   Pulse 78   Ht _0  (1.651 m)   Wt 141 lb (64 kg)   SpO2 97%   BMI 23.46 kg/m     Wt Readings from Last 5 Encounters:  05/07/20 141 lb (64 kg)  04/19/20 155 lb (70.3 kg)  11/15/13 127 lb 5 oz (57.7 kg)     Constitutional: No acute distress Eyes: sclera non-icteric, normal conjunctiva and lids ENMT: normal dentition, moist mucous membranes Cardiovascular: regular rhythm, normal rate, no murmurs. S1 and S2 normal. Radial pulses normal bilaterally. No jugular venous distention.  Respiratory: clear to auscultation bilaterally GI : normal bowel sounds, soft and nontender. No distention.   MSK: extremities warm, well perfused. No edema.  NEURO: grossly nonfocal exam, moves all extremities. PSYCH: alert and oriented x 3, normal mood and affect.   ASSESSMENT:    1. Precordial pain    PLAN:    Precordial pain - Plan: ECHOCARDIOGRAM COMPLETE, CT CORONARY MORPH W/CTA COR W/SCORE W/CA W/CM &/OR WO/CM, CT CORONARY FRACTIONAL FLOW RESERVE DATA PREP, CT CORONARY FRACTIONAL FLOW RESERVE FLUID ANALYSIS, CT CHEST W CONTRAST, Basic metabolic panel   He has chest pain and a significant family history of premature CAD. To exclude congenital abnormality, CAD and coarctation given blood pressure elevation, we will perform a CCTA and echocardiogram.  BP stable today and he is chest pain free.   We will need to check lipids at next visit otherwise can be performed by PCP.   Total time of encounter: 60 minutes total time of encounter, including 35 minutes spent in face-to-face patient  care on the date of this encounter. This time includes coordination of care and counseling regarding above mentioned problem list. Remainder of non-face-to-face time involved reviewing chart documents/testing relevant to the patient encounter and documentation in the medical record. I have independently reviewed documentation from referring provider.   Cherlynn Kaiser, MD [place]  [facility]    Medication Adjustments/Labs and Tests Ordered: Current medicines are reviewed at length with the patient today.  Concerns regarding medicines are outlined above.  Orders Placed This Encounter  Procedures  . CT CORONARY MORPH W/CTA COR W/SCORE W/CA W/CM &/OR WO/CM  . CT CORONARY FRACTIONAL FLOW RESERVE DATA PREP  .  CT CORONARY FRACTIONAL FLOW RESERVE FLUID ANALYSIS  . CT CHEST W CONTRAST  . Basic metabolic panel  . ECHOCARDIOGRAM COMPLETE   Meds ordered this encounter  Medications  . metoprolol tartrate (LOPRESSOR) 50 MG tablet    Sig: Take 2 tablets (100 mg total) by mouth once for 1 dose. TAKE TWO HOURS PRIOR TO  SCHEDULE CARDIAC TEST    Dispense:  2 tablet    Refill:  0    Patient Instructions  Medication Instructions:  SEE INSTRUCTIONS FOR CCTA *If you need a refill on your cardiac medications before your next appointment, please call your pharmacy*   Lab Work: Bmp - SEE INSTRUCTIONS  If you have labs (blood work) drawn today and your tests are completely normal, you will receive your results only by: Marland Kitchen MyChart Message (if you have MyChart) OR . A paper copy in the mail If you have any lab test that is abnormal or we need to change your treatment, we will call you to review the results.   Testing/Procedures: WILL BE SCHEDULE AT [place] Your physician has requested that you have an echocardiogram. Echocardiography is a painless test that uses sound waves to create images of your heart. It provides your doctor with information about the size and  shape of your heart and how well your heart's chambers and valves are working. This procedure takes approximately one hour. There are no restrictions for this procedure.   AND  WILL BE SCHEDULE AT  [facility] hospital at radiology dept, [place] has requested that you have cardiac CT. Cardiac computed tomography (CT) is a painless test that uses an x-ray machine to take clear, detailed pictures of your heart. For further information please visit [email or web address]. Please follow instruction sheet as given.     Follow-Up: At [facility], you and your health needs are our priority.  As part of our continuing mission to provide you with exceptional heart care, we have created designated Provider Care Teams.  These Care Teams include your primary Cardiologist (physician) and Advanced Practice Providers (APPs -  Physician Assistants and Nurse Practitioners) who all work together to provide you with the care you need, when you need it.  We recommend signing up for the patient portal called "MyChart".  Sign up information is provided on this After Visit Summary.  MyChart is used to connect with patients for Virtual Visits (Telemedicine).  Patients are able to view lab/test results, encounter notes, upcoming appointments, etc.  Non-urgent messages can be sent to your provider as well.   To learn more about what you can do with MyChart, go to [email or web address].    Your next appointment:   2 month(s)  The format for your next appointment:   In Person  Provider:   Cherlynn Kaiser, MD   Other Instructions  YOU WILL HAVE CT OF CHEST ALONG WITH CCTA   Your cardiac CT will be scheduled at  the below location:   [facility] [address] [place], [place] [address] [phone]    If scheduled at [facility], please arrive at the [facility] main entrance of [facility] 30 minutes prior to test start time. Proceed to the [facility] Radiology Department (first floor) to check-in and test prep.   Please follow these instructions carefully (unless otherwise directed):  PLEASE HAVE LAB WORK DONE 1 WEEK PRIOR TO TEST   On the Night Before the Test: .  Be sure to Drink plenty of water. . Do not consume any caffeinated/decaffeinated beverages or chocolate 12 hours prior to your test. . Do not take any antihistamines 12 hours prior to your test.   On the Day of the Test: . Drink plenty of water. Do not drink any water within one hour of the test. . Do not eat any food 4 hours prior to the test. . You may take your regular medications prior to the test.  . Take metoprolol (Lopressor) 100 MG  two hours prior to test.         After the Test: . Drink plenty of water. . After receiving IV contrast, you may experience a mild flushed feeling. This is normal. . On occasion, you may experience a mild rash up to 24 hours after the test. This is not dangerous. If this occurs, you can take Benadryl 25 mg and increase your fluid intake. . If you experience trouble breathing, this can be serious. If it is severe call 911 IMMEDIATELY. If it is mild, please call our office. .   Once we have confirmed authorization from your insurance company, we will call you to set up a date and time for your test.   For non-scheduling related questions, please contact the cardiac imaging nurse navigator should you have any questions/concerns: Marchia Bond, Cardiac Imaging Nurse Navigator Burley Saver, Interim Cardiac Imaging Nurse [place] and Vascular Services Direct Office Dial: [phone]   For scheduling needs, including cancellations and rescheduling, please call [phone].

## 2020-05-07 NOTE — Patient Instructions (Signed)
Medication Instructions:  SEE INSTRUCTIONS FOR CCTA *If you need a refill on your cardiac medications before your next appointment, please call your pharmacy*   Lab Work: Bmp - SEE INSTRUCTIONS  If you have labs (blood work) drawn today and your tests are completely normal, you will receive your results only by: Marland Kitchen MyChart Message (if you have MyChart) OR . A paper copy in the mail If you have any lab test that is abnormal or we need to change your treatment, we will call you to review the results.   Testing/Procedures: WILL BE SCHEDULE AT [place] Your physician has requested that you have an echocardiogram. Echocardiography is a painless test that uses sound waves to create images of your heart. It provides your doctor with information about the size and shape of your heart and how well your heart's chambers and valves are working. This procedure takes approximately one hour. There are no restrictions for this procedure.   AND  WILL BE SCHEDULE AT  [facility] hospital at radiology dept, [place] has requested that you have cardiac CT. Cardiac computed tomography (CT) is a painless test that uses an x-ray machine to take clear, detailed pictures of your heart. For further information please visit [email or web address]. Please follow instruction sheet as given.     Follow-Up: At [facility], you and your health needs are our priority.  As part of our continuing mission to provide you with exceptional heart care, we have created designated Provider Care Teams.  These Care Teams include your primary Cardiologist (physician) and Advanced Practice Providers (APPs -  Physician Assistants and Nurse Practitioners) who all work together to provide you with the care you need, when you need it.  We recommend signing up for the patient portal called "MyChart".  Sign up information is provided on this After Visit Summary.  MyChart is used to  connect with patients for Virtual Visits (Telemedicine).  Patients are able to view lab/test results, encounter notes, upcoming appointments, etc.  Non-urgent messages can be sent to your provider as well.   To learn more about what you can do with MyChart, go to [email or web address].    Your next appointment:   2 month(s)  The format for your next appointment:   In Person  Provider:   Cherlynn Kaiser, MD   Other Instructions  YOU WILL HAVE CT OF CHEST ALONG WITH CCTA   Your cardiac CT will be scheduled at  the below location:   [facility] [address] [place], [place] [address] [phone]    If scheduled at [facility], please arrive at the [facility] main entrance of [facility] 30 minutes prior to test start time. Proceed to the [facility] Radiology Department (first floor) to check-in and test prep.   Please follow these instructions carefully (unless otherwise directed):  PLEASE HAVE LAB WORK DONE 1 WEEK PRIOR TO TEST   On the Night Before the Test: . Be sure to Drink plenty of water. . Do not consume any caffeinated/decaffeinated beverages or chocolate 12 hours prior to your test. . Do not take any antihistamines 12 hours prior to your test.   On the Day of the Test: . Drink plenty of water. Do not drink any water within one hour of the test. . Do not eat any food 4 hours prior to the test. . You may take your regular medications prior to the test.  .  Take metoprolol (Lopressor) 100 MG  two hours prior to test.         After the Test: . Drink plenty of water. . After receiving IV contrast, you may experience a mild flushed feeling. This is normal. . On occasion, you may experience a mild rash up to 24 hours after the test. This is not dangerous. If this occurs, you can take Benadryl 25 mg and increase your fluid intake. . If you experience trouble breathing, this can be serious. If it is severe call 911 IMMEDIATELY. If  it is mild, please call our office. .   Once we have confirmed authorization from your insurance company, we will call you to set up a date and time for your test.   For non-scheduling related questions, please contact the cardiac imaging nurse navigator should you have any questions/concerns: Marchia Bond, Cardiac Imaging Nurse Navigator Burley Saver, Interim Cardiac Imaging Nurse [place] and Vascular Services Direct Office Dial: [phone]   For scheduling needs, including cancellations and rescheduling, please call [phone].

## 2020-05-29 ENCOUNTER — Other Ambulatory Visit: Payer: Self-pay

## 2020-05-29 ENCOUNTER — Ambulatory Visit (HOSPITAL_COMMUNITY): Payer: Self-pay | Attending: Cardiovascular Disease

## 2020-05-29 DIAGNOSIS — R072 Precordial pain: Secondary | ICD-10-CM

## 2020-06-06 ENCOUNTER — Telehealth (HOSPITAL_COMMUNITY): Payer: Self-pay | Admitting: *Deleted

## 2020-06-06 NOTE — Telephone Encounter (Signed)
Reaching out to patient to offer assistance regarding upcoming cardiac imaging study with the aid of a Spanish interpreter; pt verbalizes understanding of appt date/time, parking situation and where to check in, pre-test NPO status and medications ordered, and verified current allergies; name and call back number provided for further questions should they arise  [place] and Vascular [phone] office [phone] cell

## 2020-06-07 ENCOUNTER — Other Ambulatory Visit: Payer: Self-pay

## 2020-06-07 ENCOUNTER — Ambulatory Visit (HOSPITAL_COMMUNITY)
Admission: RE | Admit: 2020-06-07 | Discharge: 2020-06-07 | Disposition: A | Discharge status: Home / Self Care | Payer: Self-pay | Source: Ambulatory Visit | Attending: Internal Medicine | Admitting: Internal Medicine

## 2020-06-07 DIAGNOSIS — R072 Precordial pain: Secondary | ICD-10-CM

## 2020-06-07 MED ORDER — NITROGLYCERIN 0.4 MG SL SUBL
0.8000 mg | SUBLINGUAL_TABLET | Freq: Once | SUBLINGUAL | Status: AC
  Administered 2020-06-07: 0.8 mg via SUBLINGUAL

## 2020-06-07 MED ORDER — IOHEXOL 350 MG/ML SOLN
80.0000 mL | Freq: Once | INTRAVENOUS | Status: AC | PRN
  Administered 2020-06-07: 80 mL via INTRAVENOUS

## 2020-06-07 MED ORDER — NITROGLYCERIN 0.4 MG SL SUBL
SUBLINGUAL_TABLET | SUBLINGUAL | Status: AC
  Filled 2020-06-07: qty 2

## 2020-06-10 ENCOUNTER — Telehealth: Payer: Self-pay | Admitting: *Deleted

## 2020-06-10 NOTE — Telephone Encounter (Signed)
-----   Message from Parke Poisson, MD sent at 06/07/2020  9:45 PM EDT ----- No abnormalities of the aorta that would contribute to high blood pressure. Mild coronary artery disease. I'd like to discuss medical therapy with him given premature CAD in family, please ensure he has in office follow up with interpreter available. Thanks!

## 2020-06-10 NOTE — Telephone Encounter (Signed)
-----   Message from Parke Poisson, MD sent at 06/07/2020  9:45 PM EDT ----- Mild CAD. See result note for CT chest.

## 2020-06-10 NOTE — Telephone Encounter (Signed)
Spoke to patient through spanish interpreters   first Curtis Weber [phone] result given  And patient acknowledge and hung up.  Called back - interpreter Domingo Cocking # [phone]  patient  Verbalized understanding . Patient states when his blood pressure is elevated  He has burning sensation in his heart. It occurs everyday. RN informed patient nothing found on CT scan ,  Moved appointment up for patient to discus with Dr Jacques Navy.   Through interpreter patient verbalized  Appointment change to Aug 3 , 2021

## 2020-07-02 ENCOUNTER — Ambulatory Visit: Payer: Self-pay | Admitting: Internal Medicine

## 2020-07-15 ENCOUNTER — Ambulatory Visit: Payer: Self-pay | Admitting: Internal Medicine

## 2020-08-30 ENCOUNTER — Ambulatory Visit: Payer: Self-pay | Admitting: Internal Medicine

## 2021-06-07 ENCOUNTER — Emergency Department (HOSPITAL_COMMUNITY)
Admission: EM | Admit: 2021-06-07 | Discharge: 2021-06-07 | Disposition: A | Discharge status: Home / Self Care | Payer: Self-pay

## 2021-06-07 ENCOUNTER — Other Ambulatory Visit: Payer: Self-pay
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
## 2021-06-12 ENCOUNTER — Emergency Department (HOSPITAL_COMMUNITY): Payer: Self-pay

## 2021-06-12 ENCOUNTER — Encounter (HOSPITAL_COMMUNITY): Payer: Self-pay | Admitting: Emergency Medicine

## 2021-06-12 ENCOUNTER — Emergency Department (HOSPITAL_COMMUNITY)
Admission: EM | Admit: 2021-06-12 | Discharge: 2021-06-12 | Disposition: A | Discharge status: Home / Self Care | Payer: Self-pay | Attending: Emergency Medicine | Admitting: Emergency Medicine

## 2021-06-12 ENCOUNTER — Other Ambulatory Visit: Payer: Self-pay

## 2021-06-12 DIAGNOSIS — S069X9A Unspecified intracranial injury with loss of consciousness of unspecified duration, initial encounter: Secondary | ICD-10-CM | POA: Insufficient documentation

## 2021-06-12 DIAGNOSIS — Y92009 Unspecified place in unspecified non-institutional (private) residence as the place of occurrence of the external cause: Secondary | ICD-10-CM | POA: Insufficient documentation

## 2021-06-12 DIAGNOSIS — S0990XA Unspecified injury of head, initial encounter: Secondary | ICD-10-CM

## 2021-06-12 DIAGNOSIS — S61411A Laceration without foreign body of right hand, initial encounter: Secondary | ICD-10-CM | POA: Insufficient documentation

## 2021-06-12 DIAGNOSIS — Z23 Encounter for immunization: Secondary | ICD-10-CM | POA: Insufficient documentation

## 2021-06-12 DIAGNOSIS — M25562 Pain in left knee: Secondary | ICD-10-CM | POA: Insufficient documentation

## 2021-06-12 DIAGNOSIS — F17210 Nicotine dependence, cigarettes, uncomplicated: Secondary | ICD-10-CM | POA: Insufficient documentation

## 2021-06-12 DIAGNOSIS — R0781 Pleurodynia: Secondary | ICD-10-CM | POA: Insufficient documentation

## 2021-06-12 MED ORDER — LIDOCAINE-EPINEPHRINE (PF) 2 %-1:200000 IJ SOLN
10.0000 mL | Freq: Once | INTRAMUSCULAR | Status: AC
  Administered 2021-06-12: 10 mL
  Filled 2021-06-12: qty 20

## 2021-06-12 MED ORDER — HYDROCODONE-ACETAMINOPHEN 5-325 MG PO TABS
1.0000 | ORAL_TABLET | Freq: Once | ORAL | Status: AC
  Administered 2021-06-12: 1 via ORAL
  Filled 2021-06-12: qty 1

## 2021-06-12 MED ORDER — TETANUS-DIPHTH-ACELL PERTUSSIS 5-2.5-18.5 LF-MCG/0.5 IM SUSY
0.5000 mL | PREFILLED_SYRINGE | Freq: Once | INTRAMUSCULAR | Status: AC
  Administered 2021-06-12: 0.5 mL via INTRAMUSCULAR
  Filled 2021-06-12: qty 0.5

## 2021-06-12 MED ORDER — CEPHALEXIN 500 MG PO CAPS: 500.0000 mg | ORAL_CAPSULE | Freq: Three times a day (TID) | ORAL | 0 refills | Status: AC

## 2021-06-12 NOTE — ED Notes (Signed)
Patient transported to CT 
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
## 2021-06-12 NOTE — ED Notes (Signed)
Pt refused knee x-ray

## 2021-06-12 NOTE — Discharge Instructions (Signed)
1. Medications: Tylenol or ibuprofen for pain, antibiotics to prevent infection 2. Treatment: ice for swelling, keep wound clean with warm soap and water and keep bandage dry, do not submerge in water for 24 hours 3. Follow Up: Please return in 10-14 days to have your stitches removed or sooner if you have concerns. Return to the emergency department for increased redness, drainage of pus from the wound   WOUND CARE  Remove bandage and wash wound gently with mild soap and warm water. Reapply a new bandage after cleaning wound   Continue daily cleansing with soap and water until stitches are removed.  Do not apply any ointments or creams to the wound while stitches are in place, as this may cause delayed healing. Return if you experience any of the following signs of infection: Swelling, redness, pus drainage, streaking, fever >101.0 F  Return if you experience excessive bleeding that does not stop after 15-20 minutes of constant, firm pressure.
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
## 2021-06-12 NOTE — ED Notes (Signed)
Patient discharge instructions and prescriptions reviewed with the patient. The patient verbalized understanding of instructions. Patient discharged. ?

## 2021-06-24 ENCOUNTER — Emergency Department (HOSPITAL_COMMUNITY)
Admission: EM | Admit: 2021-06-24 | Discharge: 2021-06-24 | Disposition: A | Discharge status: Home / Self Care | Payer: Self-pay | Attending: Emergency Medicine | Admitting: Emergency Medicine

## 2021-06-24 DIAGNOSIS — F17210 Nicotine dependence, cigarettes, uncomplicated: Secondary | ICD-10-CM | POA: Insufficient documentation

## 2021-06-24 DIAGNOSIS — S61411D Laceration without foreign body of right hand, subsequent encounter: Secondary | ICD-10-CM | POA: Insufficient documentation

## 2021-06-24 DIAGNOSIS — Z4802 Encounter for removal of sutures: Secondary | ICD-10-CM | POA: Insufficient documentation

## 2021-06-24 NOTE — ED Provider Notes (Signed)
Emergency Medicine Provider Triage Evaluation Note  Curtis Weber , a 30 y.o. male  was evaluated in triage.  Pt complains of suture removal. 3 Prolene sutures R hand.   Review of Systems  Positive: wound Negative: drainage  Physical Exam  There were no vitals taken for this visit. Gen:   Awake, no distress   Resp:  Normal effort  MSK:   Moves extremities without difficulty,  3 Prolene sutures R hand, able to flex/extend Other:    Medical Decision Making  Medically screening exam initiated at 10:09 AM.  Appropriate orders placed.  Curtis Weber was informed that the remainder of the evaluation will be completed by another provider, this initial triage assessment does not replace that evaluation, and the importance of remaining in the ED until their evaluation is complete.     Renne Crigler, PA-C 06/24/21 1010    Cathren Laine, MD 06/26/21 1309

## 2021-06-24 NOTE — Discharge Instructions (Addendum)
Watch for signs of infection.   Return if you have any worsening symptoms.

## 2021-06-24 NOTE — ED Triage Notes (Signed)
Pt arrives for removal of three stitches to R hand.
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
## 2021-07-23 IMAGING — CR DG CHEST 2V
2 series · 2 of 2 positions shown · non-contrast
Comparison: 02/12/2015

CLINICAL DATA: Chest tightness, shortness of breath, dizziness

EXAM:
CHEST - 2 VIEW

[chest pa]
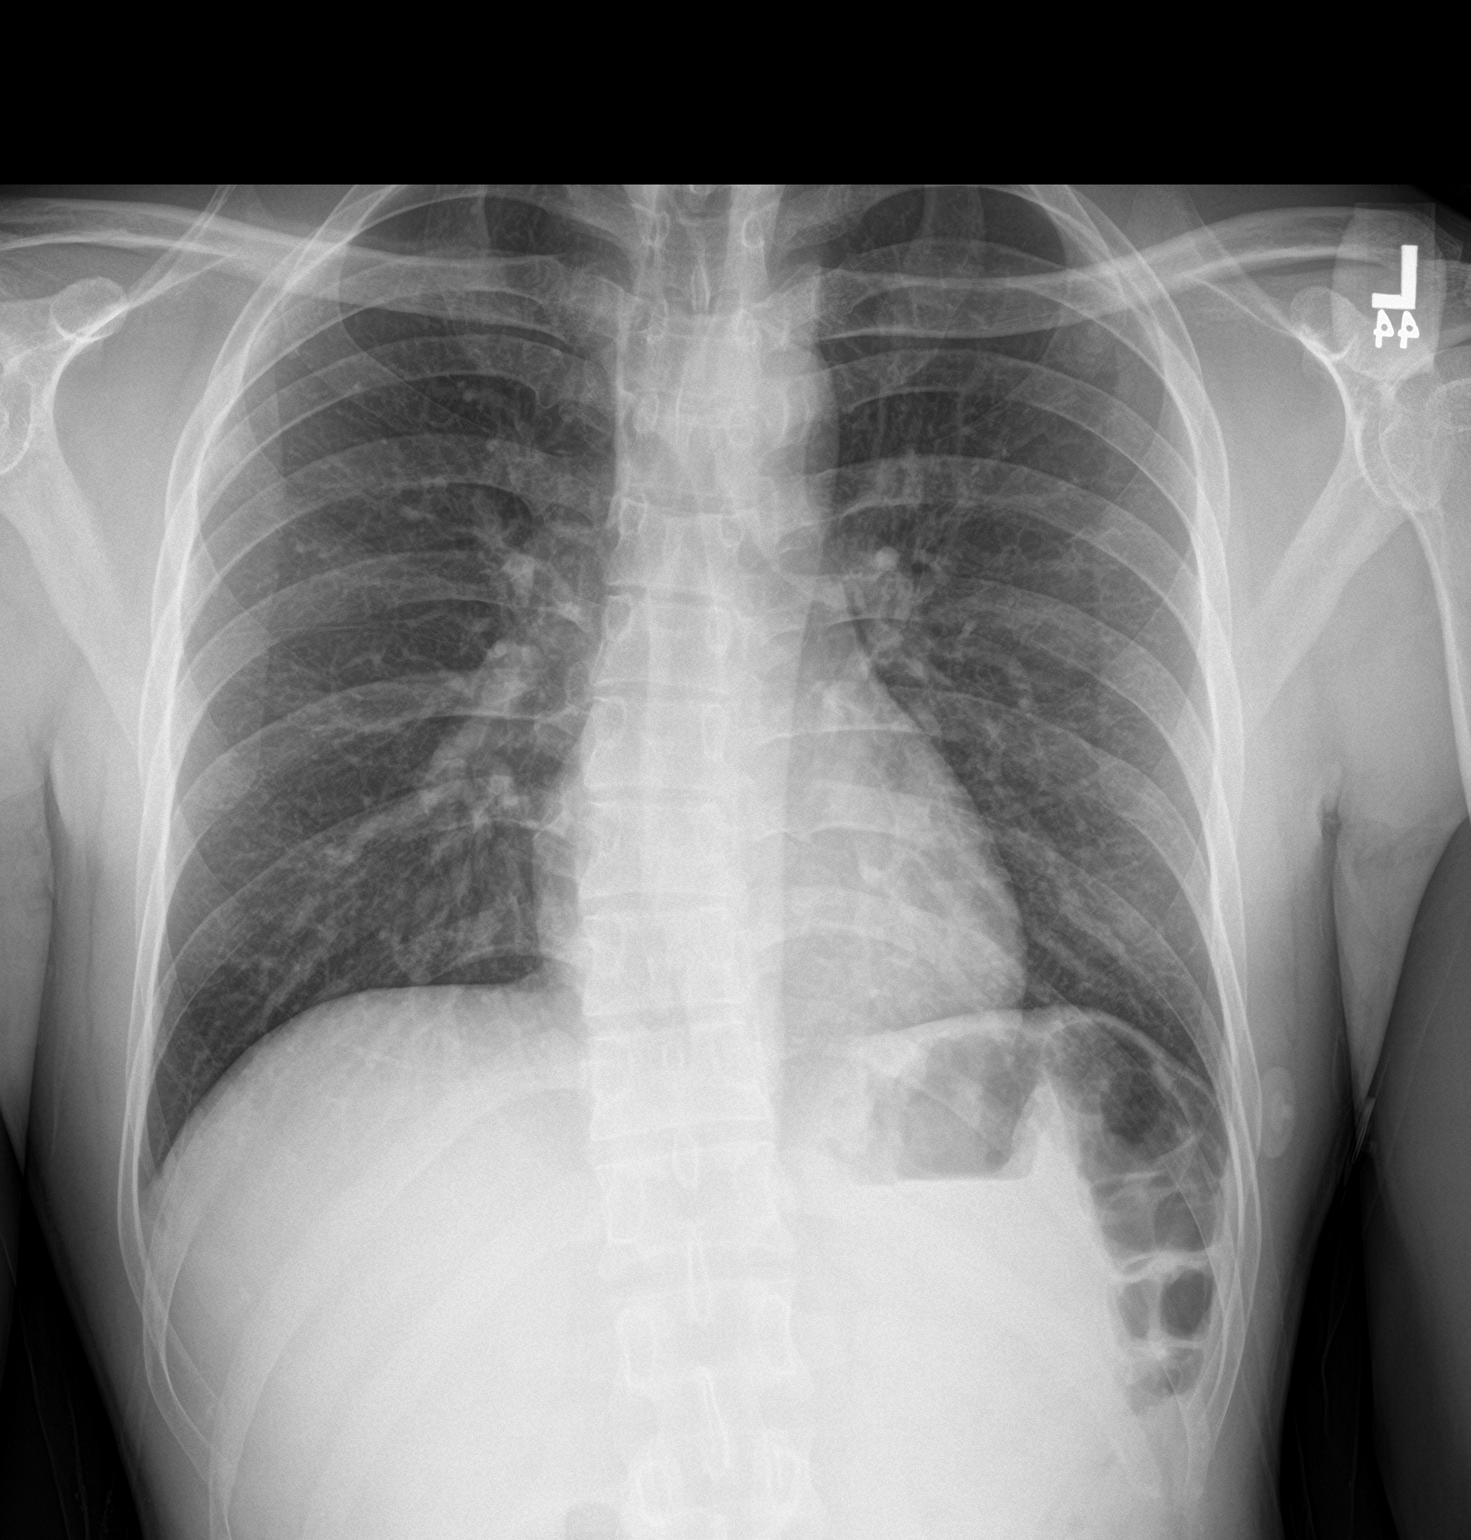

[chest lat]
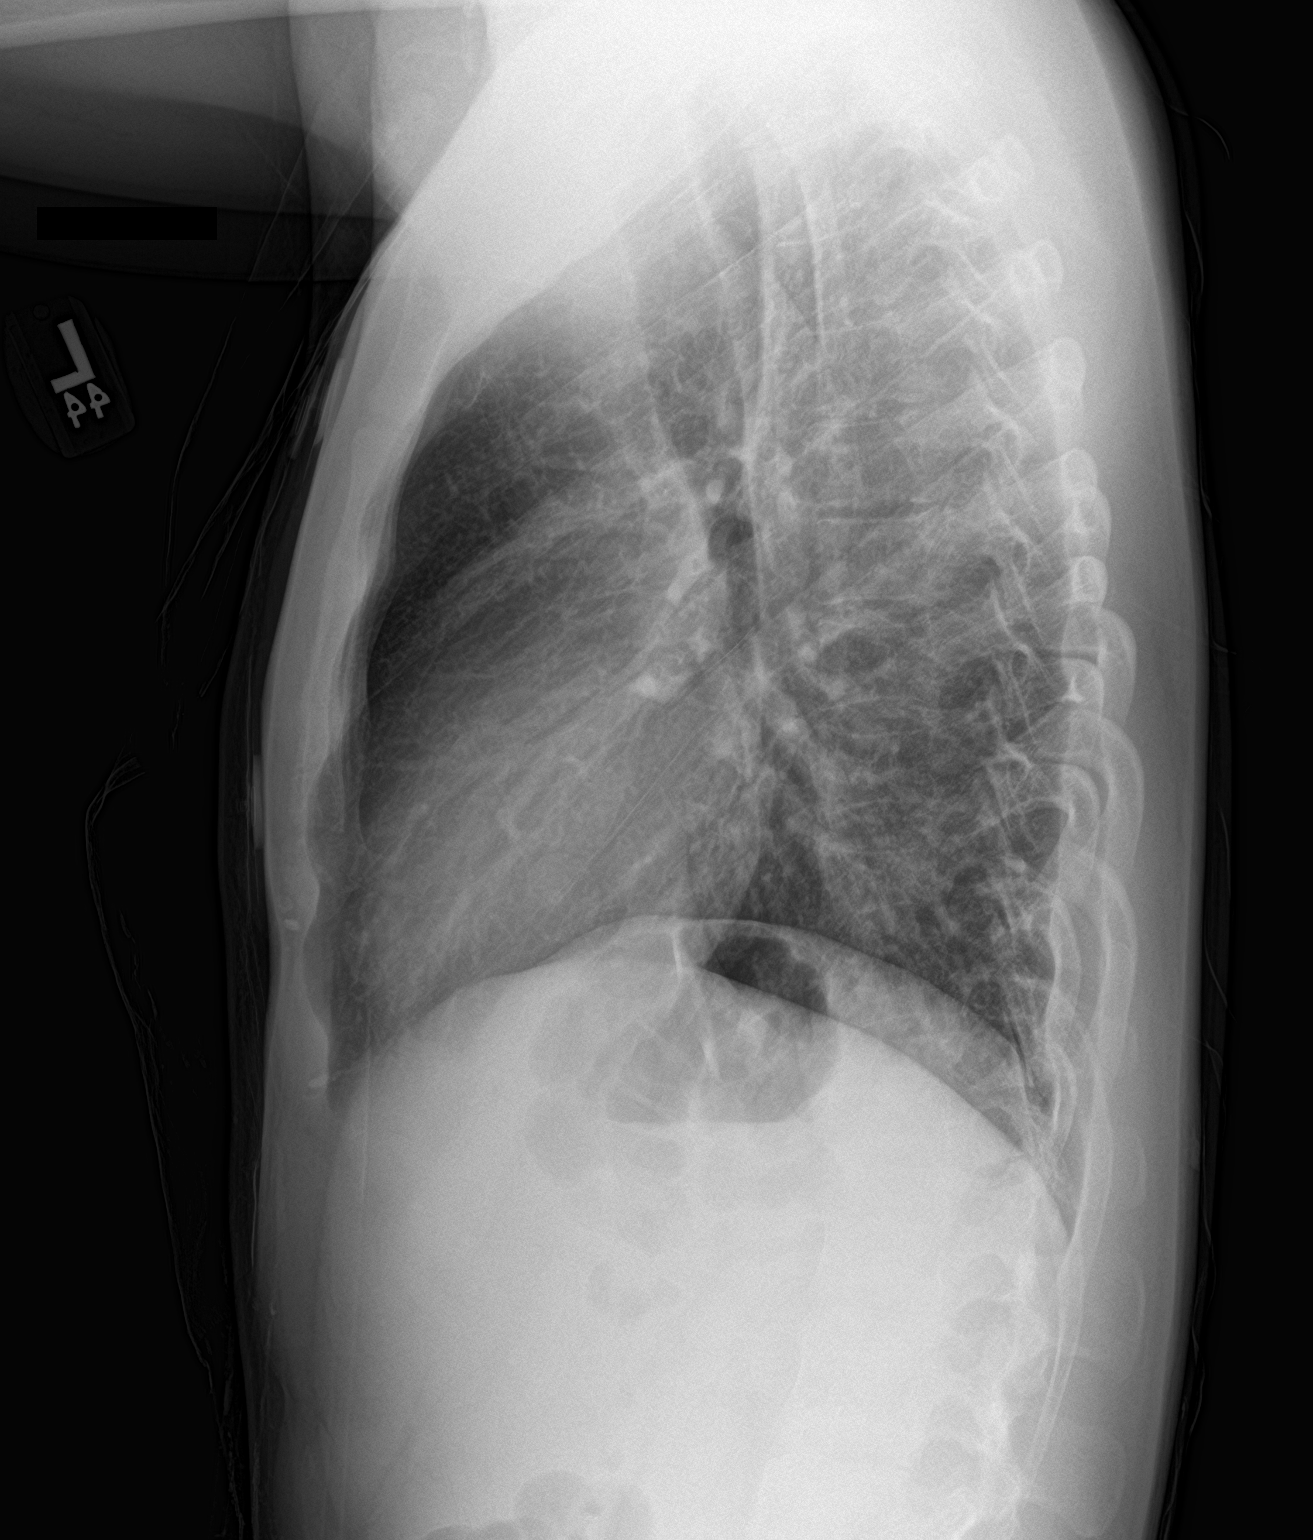

[2 of 2 positions shown; findings below may reference images not displayed]

FINDINGS: The heart size and mediastinal contours are within normal limits.
Both lungs are clear. The visualized skeletal structures are
unremarkable.
IMPRESSION: No active cardiopulmonary disease.
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
